# Patient Record
Sex: Male | Born: 1965
Health system: Southern US, Community
[De-identification: ages and names within clinical notes are randomized; demographics above are authoritative.]

## PROBLEM LIST (undated history)

## (undated) DIAGNOSIS — H539 Unspecified visual disturbance: Secondary | ICD-10-CM

## (undated) DIAGNOSIS — K529 Noninfective gastroenteritis and colitis, unspecified: Secondary | ICD-10-CM

## (undated) DIAGNOSIS — N529 Male erectile dysfunction, unspecified: Secondary | ICD-10-CM

## (undated) DIAGNOSIS — E0590 Thyrotoxicosis, unspecified without thyrotoxic crisis or storm: Secondary | ICD-10-CM

## (undated) HISTORY — DX: Noninfective gastroenteritis and colitis, unspecified: K52.9

## (undated) HISTORY — DX: Thyrotoxicosis, unspecified without thyrotoxic crisis or storm: E05.90

## (undated) HISTORY — PX: VASECTOMY: SHX75

## (undated) HISTORY — PX: APPENDECTOMY: SHX54

## (undated) HISTORY — DX: Unspecified visual disturbance: H53.9

## (undated) HISTORY — DX: Male erectile dysfunction, unspecified: N52.9

---

## 2003-07-10 ENCOUNTER — Encounter (INDEPENDENT_AMBULATORY_CARE_PROVIDER_SITE_OTHER): Payer: Self-pay

## 2003-07-10 ENCOUNTER — Ambulatory Visit (HOSPITAL_COMMUNITY): Admission: RE | Admit: 2003-07-10 | Discharge: 2003-07-10 | Payer: Self-pay | Admitting: Gastroenterology

## 2006-06-04 ENCOUNTER — Observation Stay (HOSPITAL_COMMUNITY): Admission: EM | Admit: 2006-06-04 | Discharge: 2006-06-05 | Payer: Self-pay | Admitting: Emergency Medicine

## 2006-06-04 ENCOUNTER — Encounter (INDEPENDENT_AMBULATORY_CARE_PROVIDER_SITE_OTHER): Payer: Self-pay | Admitting: *Deleted

## 2007-05-29 ENCOUNTER — Ambulatory Visit: Payer: Self-pay | Admitting: Family Medicine

## 2007-05-30 LAB — CONVERTED CEMR LAB
Cholesterol: 227 mg/dL (ref 0–200)
HDL: 47.9 mg/dL (ref >39.0)
PSA: 0.39 ng/mL (ref 0.10–4.00)
Total CHOL/HDL Ratio: 4.7

## 2007-06-01 ENCOUNTER — Encounter (INDEPENDENT_AMBULATORY_CARE_PROVIDER_SITE_OTHER): Payer: Self-pay | Admitting: *Deleted

## 2007-06-01 ENCOUNTER — Telehealth (INDEPENDENT_AMBULATORY_CARE_PROVIDER_SITE_OTHER): Payer: Self-pay | Admitting: *Deleted

## 2008-01-24 ENCOUNTER — Ambulatory Visit: Payer: Self-pay | Admitting: Internal Medicine

## 2008-01-27 LAB — CONVERTED CEMR LAB
ALT: 34 units/L (ref 0–53)
AST: 24 units/L (ref 0–37)
Albumin: 4 g/dL (ref 3.5–5.2)
CO2: 29 meq/L (ref 19–32)
Calcium: 9.2 mg/dL (ref 8.4–10.5)
Chloride: 104 meq/L (ref 96–112)
Eosinophils Absolute: 0.2 10*3/uL (ref 0.0–0.7)
GFR calc Af Amer: 104 mL/min
HCT: 44.3 % (ref 39.0–52.0)
Hemoglobin: 15.7 g/dL (ref 13.0–17.0)
Lymphocytes Relative: 29.4 % (ref 12.0–46.0)
MCV: 94.2 fL (ref 78.0–100.0)
Monocytes Absolute: 0.2 10*3/uL (ref 0.1–1.0)
Platelets: 251 10*3/uL (ref 150–400)
Potassium: 4.4 meq/L (ref 3.5–5.1)
Rhuematoid fact SerPl-aCnc: <20 intl units/mL — ABNORMAL LOW (ref 0.0–20.0)
Sodium: 139 meq/L (ref 135–145)
Total Bilirubin: 0.9 mg/dL (ref 0.3–1.2)
Total CK: 119 units/L (ref 7–195)

## 2008-01-29 ENCOUNTER — Encounter (INDEPENDENT_AMBULATORY_CARE_PROVIDER_SITE_OTHER): Payer: Self-pay | Admitting: *Deleted

## 2008-05-30 ENCOUNTER — Ambulatory Visit: Payer: Self-pay | Admitting: Internal Medicine

## 2008-06-02 DIAGNOSIS — N529 Male erectile dysfunction, unspecified: Secondary | ICD-10-CM

## 2008-06-02 HISTORY — DX: Male erectile dysfunction, unspecified: N52.9

## 2008-06-24 ENCOUNTER — Ambulatory Visit: Payer: Self-pay | Admitting: Internal Medicine

## 2008-06-24 DIAGNOSIS — F528 Other sexual dysfunction not due to a substance or known physiological condition: Secondary | ICD-10-CM | POA: Insufficient documentation

## 2008-06-24 DIAGNOSIS — F411 Generalized anxiety disorder: Secondary | ICD-10-CM | POA: Insufficient documentation

## 2008-06-24 DIAGNOSIS — N529 Male erectile dysfunction, unspecified: Secondary | ICD-10-CM | POA: Insufficient documentation

## 2008-06-25 ENCOUNTER — Encounter: Payer: Self-pay | Admitting: Internal Medicine

## 2008-08-16 ENCOUNTER — Telehealth (INDEPENDENT_AMBULATORY_CARE_PROVIDER_SITE_OTHER): Payer: Self-pay | Admitting: *Deleted

## 2008-09-04 ENCOUNTER — Encounter: Payer: Self-pay | Admitting: Internal Medicine

## 2008-09-24 ENCOUNTER — Ambulatory Visit: Payer: Self-pay | Admitting: Internal Medicine

## 2008-09-26 LAB — CONVERTED CEMR LAB
Cholesterol: 226 mg/dL
Direct LDL: 146.9 mg/dL
Free T4: 0.7 ng/dL
HDL: 48.8 mg/dL
T3, Free: 3.8 pg/mL
TSH: 0.41 u[IU]/mL
Total CHOL/HDL Ratio: 4.6
Triglycerides: 98 mg/dL
VLDL: 20 mg/dL

## 2009-03-18 ENCOUNTER — Ambulatory Visit: Payer: Self-pay | Admitting: Internal Medicine

## 2009-03-24 ENCOUNTER — Ambulatory Visit: Payer: Self-pay | Admitting: Family Medicine

## 2009-03-24 ENCOUNTER — Emergency Department (HOSPITAL_COMMUNITY): Admission: EM | Admit: 2009-03-24 | Discharge: 2009-03-24 | Payer: Self-pay | Admitting: Emergency Medicine

## 2009-03-25 ENCOUNTER — Encounter: Payer: Self-pay | Admitting: Internal Medicine

## 2009-03-26 ENCOUNTER — Telehealth: Payer: Self-pay | Admitting: Internal Medicine

## 2009-03-28 ENCOUNTER — Encounter: Payer: Self-pay | Admitting: Internal Medicine

## 2009-03-31 ENCOUNTER — Telehealth: Payer: Self-pay | Admitting: Internal Medicine

## 2009-06-06 ENCOUNTER — Encounter: Payer: Self-pay | Admitting: Internal Medicine

## 2009-06-06 LAB — CONVERTED CEMR LAB
AST: 29 units/L
Alkaline Phosphatase: 51 units/L
BUN: 13 mg/dL
CO2, serum: 26 mmol/L
Calcium: 8.6 mg/dL
Creatinine, Ser: 0.8 mg/dL
Glucose, Bld: 111 mg/dL
HCT: 39.9 %
Hemoglobin: 13.9 g/dL
RDW: 12.3 %
Sodium, serum: 133 mmol/L
WBC, blood: 7 10*3/uL

## 2009-07-01 ENCOUNTER — Encounter: Payer: Self-pay | Admitting: Internal Medicine

## 2009-10-03 ENCOUNTER — Ambulatory Visit: Payer: Self-pay | Admitting: Internal Medicine

## 2009-10-03 ENCOUNTER — Encounter (INDEPENDENT_AMBULATORY_CARE_PROVIDER_SITE_OTHER): Payer: Self-pay | Admitting: *Deleted

## 2009-12-19 ENCOUNTER — Ambulatory Visit: Payer: Self-pay | Admitting: Internal Medicine

## 2009-12-24 ENCOUNTER — Telehealth (INDEPENDENT_AMBULATORY_CARE_PROVIDER_SITE_OTHER): Payer: Self-pay | Admitting: *Deleted

## 2009-12-25 ENCOUNTER — Encounter (HOSPITAL_COMMUNITY): Admission: RE | Admit: 2009-12-25 | Discharge: 2010-03-04 | Payer: Self-pay | Admitting: Internal Medicine

## 2009-12-25 ENCOUNTER — Ambulatory Visit: Payer: Self-pay | Admitting: Internal Medicine

## 2009-12-25 ENCOUNTER — Ambulatory Visit: Payer: Self-pay

## 2009-12-26 ENCOUNTER — Telehealth: Payer: Self-pay | Admitting: Internal Medicine

## 2009-12-30 ENCOUNTER — Telehealth (INDEPENDENT_AMBULATORY_CARE_PROVIDER_SITE_OTHER): Payer: Self-pay | Admitting: *Deleted

## 2009-12-31 ENCOUNTER — Ambulatory Visit: Payer: Self-pay | Admitting: Internal Medicine

## 2009-12-31 LAB — CONVERTED CEMR LAB
Ferritin: 7.4 ng/mL — ABNORMAL LOW (ref 22.0–322.0)
Iron: 56 ug/dL (ref 42–165)
T3, Free: 2.8 pg/mL (ref 2.3–4.2)
Vitamin B-12: >1500 pg/mL — ABNORMAL HIGH (ref 211–911)

## 2010-01-02 ENCOUNTER — Telehealth: Payer: Self-pay | Admitting: Internal Medicine

## 2010-01-02 DIAGNOSIS — K5190 Ulcerative colitis, unspecified, without complications: Secondary | ICD-10-CM | POA: Insufficient documentation

## 2010-08-28 ENCOUNTER — Ambulatory Visit
Admission: RE | Admit: 2010-08-28 | Discharge: 2010-08-28 | Payer: Self-pay | Source: Home / Self Care | Attending: Internal Medicine | Admitting: Internal Medicine

## 2010-08-28 DIAGNOSIS — H60399 Other infective otitis externa, unspecified ear: Secondary | ICD-10-CM | POA: Insufficient documentation

## 2010-08-30 LAB — CONVERTED CEMR LAB
BUN: 18 mg/dL (ref 6–23)
Basophils Absolute: 0 10*3/uL (ref 0.0–0.1)
Basophils Relative: 0.3 % (ref 0.0–3.0)
CO2: 28 meq/L (ref 19–32)
Calcium: 8.7 mg/dL (ref 8.4–10.5)
Chloride: 109 meq/L (ref 96–112)
Cholesterol: 202 mg/dL — ABNORMAL HIGH (ref 0–200)
Creatinine, Ser: 1 mg/dL (ref 0.4–1.5)
Direct LDL: 139.9 mg/dL
GFR calc non Af Amer: 90.67 mL/min (ref >60)
Glucose, Bld: 83 mg/dL (ref 70–99)
HCT: 37.4 % — ABNORMAL LOW (ref 39.0–52.0)
Hemoglobin: 12.9 g/dL — ABNORMAL LOW (ref 13.0–17.0)
Lymphs Abs: 3.5 10*3/uL (ref 0.7–4.0)
MCV: 90.7 fL (ref 78.0–100.0)
Monocytes Absolute: 0.9 10*3/uL (ref 0.1–1.0)
Neutro Abs: 3.7 10*3/uL (ref 1.4–7.7)
Neutrophils Relative %: 45.7 % (ref 43.0–77.0)
RDW: 13.2 % (ref 11.5–14.6)
Triglycerides: 56 mg/dL (ref 0.0–149.0)

## 2010-09-01 NOTE — Consult Note (Signed)
Summary: back pain better, conservative treatment   [facility]   Imported By: Edmonia James 04/03/2009 10:01:20  _____________________________________________________________________  External Attachment:    Type:   Image     Comment:   External Document

## 2010-09-01 NOTE — Letter (Signed)
Summary: [place] No Show Letter  [facility]   [place], [place] [address]   Phone: [phone]  Fax: [phone]    10/03/2009 MRN: [id number]  [facility] [address] [place], [place]  [address]   Dear Mr. Chevez,   Our records indicate that you missed your scheduled appointment with Dr. Larose Kells on 10/03/09.  Please contact this office to reschedule your appointment as soon as possible.  It is important that you keep your scheduled appointments with your physician, so we can provide you the best care possible.  Please be advised that there may be a charge for "no show" appointments.    Sincerely,    at [employer]

## 2010-09-01 NOTE — Assessment & Plan Note (Signed)
Summary: Cardiology Nuclear Study  Nuclear Med Background Indications for Stress Test: Evaluation for Ischemia     Symptoms: Chest Pain, Nausea, SOB, Vomiting    Nuclear Pre-Procedure Cardiac Risk Factors: History of Smoking Caffeine/Decaff Intake: None NPO After: 8:00 PM Lungs: clear IV 0.9% NS with Angio Cath: 20g     IV Site: (R) AC IV Started by: Irven Baltimore RN Chest Size (in) 44     Height (in): 72 Weight (lb): 206 BMI: 28.04  Nuclear Med Study 1 or 2 day study:  1 day     Stress Test Type:  Stress Reading MD:  Dorris Carnes, MD     Referring MD:  J.Paz Resting Radionuclide:  Technetium 3mTetrofosmin     Resting Radionuclide Dose:  11.0 mCi  Stress Radionuclide:  Technetium 922metrofosmin     Stress Radionuclide Dose:  33.0 mCi   Stress Protocol Exercise Time (min):  9:45 min     Max HR:  155 bpm     Predicted Max HR:  17885pm  Max Systolic BP: 13027m Hg     Percent Max HR:  89.08 %     METS: 11.3 Rate Pressure Product:  20770    Stress Test Technologist:  SaPerrin MalteseMT-P     Nuclear Technologist:  StCharlton AmorNMT  Rest Procedure  Myocardial perfusion imaging was performed at rest 45 minutes following the intravenous administration of Myoview Technetium 9986mtrofosmin.  Stress Procedure  The patient exercised for 9:45. The patient stopped due to fatigue and denied any chest pain.  There were no significant ST-T wave changes and the patient became pale and very fatigued at the end of his exercise..  Myoview was injected at peak exercise and myocardial perfusion imaging was performed after a brief delay.  QPS Raw Data Images:  Images were motion corrected.  SOft tissue (diaphragm) underlies heart. Stress Images:  Normal perfusion with apical thinning. Rest Images:  Near normalization of the apical defect. Subtraction (SDS):  No evidence of ischemia. Transient Ischemic Dilatation:  .93  (Normal <1.22)  Lung/Heart Ratio:  .35  (Normal  <0.45)  Quantitative Gated Spect Images QGS EDV:  122 ml QGS ESV:  54 ml QGS EF:  56 %   Overall Impression  Exercise Capacity: Excellent exercise capacity. BP Response: Normal blood pressure response. Clinical Symptoms: No chest pain ECG Impression: No significant ST segment change suggestive of ischemia. Overall Impression Comments: Probable normal perfusion and mild apical thinning .  No evidence for significant ishemia.  Excellent exercise tolerance.   Appended Document: Cardiology Nuclear Study advise patient stress tes neg.   Appended Document: Cardiology Nuclear Study see phone note

## 2010-09-01 NOTE — Progress Notes (Signed)
Summary: add or redraw  additional labs  Phone Note Outgoing Call   Summary of Call: mild anemia, please add or redraw--------  iron, ferritin, B12   dx anemia TSH slightly suppressed--- ------ free T3, free T4    dx ? hyperthyroidism Belford Pascucci E. Damien Batty MD  Dec 26, 2009 10:09 AM  scheduled pt for additional labs next week......Marland KitchenDawson Bills  Dec 26, 2009 10:26 AM   New Problems: ? of HYPERTHYROIDISM (ICD-242.90) ANEMIA (ICD-285.9)   New Problems: ? of HYPERTHYROIDISM (ICD-242.90) ANEMIA (ICD-285.9)

## 2010-09-01 NOTE — Progress Notes (Signed)
Summary: results  Phone Note Outgoing Call Call back at Home Phone [phone] Call back at Work Phone [phone]   Reason for Call: Discuss lab or test results Details for Reason: advise patient stress tes neg.   Signed by Alda Berthold Paz MD on 12/28/2009 at 7:18 AM Summary of Call: left message on machine for pt to return call......Marland KitchenDawson Bills  Dec 30, 2009 1:26 PM discussed with pt............Marland KitchenDawson Bills  Dec 30, 2009 3:43 PM

## 2010-09-01 NOTE — Progress Notes (Signed)
Summary: lab results  Phone Note Outgoing Call   Summary of Call: --TSH slightly suppressed, TFTs  otherwise normal --hemoglobin slightly down,  iron normal. History of colitis, had a colonoscopy a few months ago called patient and LMOM: cholesterol okay hemoglobin is slightly down,   will fax labs  to GI plan this is the same , followup here in 3 months and needs a stress test Jose E. Paz MD  January 02, 2010 9:57 AM    New Problems: ULCERATIVE COLITIS (ICD-556.9)   New Problems: ULCERATIVE COLITIS (ICD-556.9)

## 2010-09-01 NOTE — Assessment & Plan Note (Signed)
Summary: cpx/kdc   Vital Signs:  Patient profile:   45 year old male Height:      72 inches Weight:      210 pounds BMI:     28.58 Pulse rate:   72 / minute BP sitting:   100 / 80  Vitals Entered By: Dawson Bills (Dec 19, 2009 8:41 AM) CC: cpx   History of Present Illness: CPX was under a lot of stress (finances) for the last few months, situation is better lately. during one of the most anxious nights a month ago, he had a one hour episode of chest pain, located in the whole chest and epigastrium, no radiation to the neck or arms, associated with nausea ,vomiting, shortness of breath but no diaphoresis. "I almost went to the ER" No further episodes   Boil at R inner leg, was seen a ta a UC yesterday, Rx meds, no I&D; saw some d/c  but overall is better today   Current Medications (verified): 1)  Viagra 100 Mg Tabs (Sildenafil Citrate) .Marland Kitchen.. 1 A Day As Needed  Allergies (verified): No Known Drug Allergies  Past History:  Past Medical History: Reviewed history from 06/24/2008 and no changes required. ED dx 11-09  Past Surgical History: Reviewed history from 05/29/2007 and no changes required. Appendectomy Vasectomy  Family History: Reviewed history from 06/24/2008 and no changes required. DM--M  HTN--M? 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 CV:  Denies palpitations and swelling of feet. Resp:  Denies cough and shortness of breath. GI:  occasionally nausea no heartburn . GU:  Denies dysuria, hematuria, and urinary hesitancy; lately, ED has not been a  issue. Psych:  ++ stress.  Physical Exam  General:  alert and well-developed.   Neck:   no masses and no thyromegaly.   Lungs:  normal respiratory effort, no intercostal retractions, no accessory muscle use, and normal breath sounds.   Heart:  normal rate, regular rhythm, no murmur, and no gallop.   Abdomen:  soft, non-tender, no distention, no masses, and no guarding.   Extremities:  no edema Skin:  has a 1.5 cm boil at the inner upper right leg. No fluctuancy    Impression & Recommendations:  Problem # 1:  PREVENTIVE HEALTH CARE (ICD-V70.0) Td 08 h/o Cscope per GI  he used to exercise more, encouraged to go back to his routine He is not smoking or drinking at all in the last few months,praised   Orders: Venipuncture (16967) TLB-BMP (Basic Metabolic Panel-BMET) (89381-OFBPZWC) TLB-Lipid Panel (80061-LIPID) TLB-TSH (Thyroid Stimulating Hormone) (84443-TSH) TLB-ALT (SGPT) (84460-ALT) TLB-AST (SGOT) (84450-SGOT) TLB-CBC Platelet - w/Differential (85025-CBCD)  Problem # 2:  CHEST PAIN (ICD-786.50) Assessment: New  one hour history of chest pain a month ago in the setting of severe anxiety He is a 45 year old gentleman, former smoker, no family history. last LDL 146. EKG today shows sinus bradycardia, no acute changes, no old EKGs Plan: Aspirin 81 Stress test  Orders: EKG w/ Interpretation (93000) Cardiology Referral (Cardiology)  Problem # 3:  ANXIETY (ICD-300.00) anxiety symptoms were severe but now better declined meds   Complete Medication List: 1)  Viagra 100 Mg Tabs (Sildenafil citrate) .Marland Kitchen.. 1 a  day as needed 2)  Aspirin 81 Mg Tbec (Aspirin) .... One daily  Patient Instructions: 1)  Please schedule a follow-up appointment in 3 months .    Preventive Care Screening  Prior Values:    PSA:  0.39 (05/29/2007)    Last Tetanus Booster:  Tdap (05/29/2007)

## 2010-09-01 NOTE — Progress Notes (Signed)
Summary: Nuclear Pre-Procedure  Phone Note Outgoing Call Call back at [facility] Phone [phone] Call back at Work Phone [phone]   Call placed by: Eliezer Lofts, EMT-P,  Dec 24, 2009 3:34 PM Call placed to: Patient Action Taken: Phone Call Completed Summary of Call: Reviewed information on Myoview Information Sheet (see scanned document for further details).  Spoke with Patient and his son, Aaron Edelman.    Nuclear Med Background Indications for Stress Test: Evaluation for Ischemia     Symptoms: Chest Pain, Nausea, SOB, Vomiting    Nuclear Pre-Procedure Cardiac Risk Factors: History of Smoking Height (in): 72

## 2010-09-09 NOTE — Assessment & Plan Note (Signed)
Summary: ears itching/kn   Vital Signs:  Patient profile:   45 year old male Weight:      227.13 pounds Pulse rate:   84 / minute Pulse rhythm:   regular BP sitting:   128 / 82  (left arm) Cuff size:   large  Vitals Entered By: Allyn Kenner CMA (August 28, 2010 10:54 AM) CC: Ears "itching"  Comments x 2 weeks some pain [facility] [address]    History of Present Illness: L>R ear itching and irritation for 3 weeks.  ROS No recent URIs No ear  discharge No decrease hearing He has a history of ulcerative colitis, denies nausea, vomiting, abdominal pain. No blood in the stools or diarrhea  Current Medications (verified): 1)  None  Allergies (verified): No Known Drug Allergies  Past History:  Past Medical History: ED dx 11-09 history of ulcerative colitis Chest pain, negative stress test 11/2009  Past Surgical History: Reviewed history from 05/29/2007 and no changes required. Appendectomy Vasectomy  Social History: Reviewed history from 12/19/2009 and no changes required. 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   Ears:  R ear normal and L ear ear canal is slightly red, no discharge. Both tympanic membranes are intact Psych:  not anxious appearing and not depressed appearing.     Impression & Recommendations:  Problem # 1:  EXTERNAL OTITIS (ICD-380.10) mild otitis externa, see prescription His updated medication list for this problem includes:    Vosol Hc 2-1 % Soln (Hydrocortisone-acetic acid) .Marland KitchenMarland KitchenMarland KitchenMarland Kitchen 5 drops in each ear 3 times a day for one week  Problem # 2:  ULCERATIVE COLITIS (ICD-556.9) currently asymptomatic, no taking  any meds    Complete Medication List: 1)  Vosol Hc 2-1 % Soln (Hydrocortisone-acetic acid)  .... 5 drops in each ear 3 times a day for one week  Patient Instructions: 1)  Please schedule a follow-up appointment in 4 months . fasting, Physical exam Prescriptions: VOSOL HC 2-1 % SOLN (HYDROCORTISONE-ACETIC ACID) 5 drops in each ear 3 times a day for one week  #1 x 0   Entered and Authorized by:   Alda Berthold. Presten Joost MD   Signed by:   Alda Berthold. Kaisey Huseby MD on 08/28/2010   Method used:   Print then Give to Patient   RxID:   [id number]    Orders Added: 1)  Est. Patient Level III [10315]

## 2010-12-18 NOTE — Op Note (Signed)
NAMEROHN, FRITSCH             ACCOUNT NO.:  [id number]   MEDICAL RECORD NO.:  [id number]          PATIENT TYPE:  INP   LOCATION:  [place]                         FACILITY:  [facility]   PHYSICIAN:  Georgina Quint, M.D.   DATE OF BIRTH:  [birth year]   DATE OF PROCEDURE:  06/05/2006  DATE OF DISCHARGE:  06/05/2006                                 OPERATIVE REPORT   PRE AND POSTOPERATIVE DIAGNOSIS:  Acute appendicitis.   OPERATION:  Laparoscopic appendectomy.   SURGEON:  Georgina Quint, M.D.   ANESTHESIA:  Local and general.   BLOOD LOSS:  Was minimal.   COMPLICATIONS:  None.   SPECIMEN:  Appendix.   PROCEDURE:  After the patient was monitored and asleep and had a Foley  catheter and routine preparation and draping the abdomen, I anesthetized  spot just below the umbilicus and made a short vertical incision, then about  a 2 cm incision in the midline fascia and bluntly entered the peritoneum.  I  put a 0 Vicryl for pursestring suture in the fascia and secured a Hassan  cannula and inflated the abdomen with carbon dioxide.  After adequate  pneumoperitoneum, I viewed the right lower quadrant and saw inflammation.  I  saw no other abnormalities of the viscera, after placing two additional  ports through anesthetized sites, I retracted the cecum upward and saw an  inflamed appendix.  It was somewhat difficult to mobilize but with  persistence, I was able to mobilize it and thin down the mesentery enough  that I could staple across the appendix and mesentery with cutting  endoscopic stapler.  I placed the appendix in a plastic pouch.  I got  hemostasis with cautery.  I irrigated the area and removed the irrigant.  There was a small amount of fluid in the pelvis which I removed also.  The  sponge, needle and instrument counts were correct.  I withdrew the appendix  from the abdomen through the umbilical incision and tied the pursestring  suture.  I removed the right upper quadrant port  under direct view and saw  no bleeding from the belly wall.  After allowing carbon dioxide to escape, I  removed the left lower quadrant port.  I closed the skin incisions with  intracuticular 4-0 Vicryl and Steri-Strips.  The patient tolerated the  operation well.      Georgina Quint, M.D.  Electronically Signed     WB/MEDQ  D:  06/05/2006  T:  06/06/2006  Job:  [id number]

## 2010-12-18 NOTE — Op Note (Signed)
Travis Greene, Travis Greene                         ACCOUNT NO.:  [id number]   MEDICAL RECORD NO.:  [id number]                   PATIENT TYPE:  AMB   LOCATION:  ENDO                                 FACILITY:  [facility]   PHYSICIAN:  Wonda Horner, M.D.                DATE OF BIRTH:  [birth year]   DATE OF PROCEDURE:  07/10/2003  DATE OF DISCHARGE:                                 OPERATIVE REPORT   PROCEDURE:  Colonoscopy with biopsy.   INDICATIONS FOR PROCEDURE:  Heme positive stool, rectal bleeding.   Informed consent was obtained after explanation of the risks of bleeding,  infection and perforation.   PREMEDICATION:  Fentanyl 75 mcg IV, Versed 8 mg IV.   DESCRIPTION OF PROCEDURE:  With the patient in the left lateral decubitus  position, a rectal exam was performed and no masses were felt. The Olympus  colonoscope was then inserted into the rectum and advanced around the colon  to the cecum. Cecal landmarks were identified. The cecum and ascending colon  were normal. The transverse colon was normal.  The descending colon was  normal.  The sigmoid and rectum revealed erythematous mucosa with loss of  the appearance of the normal underlying submucosal vasculature with findings  that seemed consistent with a proctosigmoiditis.  Biopsies were obtained  from the sigmoid and rectum for histology.  He tolerated the procedure well  without complications.   IMPRESSION:  Proctosigmoiditis, biopsies pending.   PLAN:  The biopsies will be checked, further decisions will be made after  review of the biopsies.                                               Wonda Horner, M.D.    SFG/MEDQ  D:  07/10/2003  T:  07/10/2003  Job:  [id number]   cc:   Leone Haven, M.D.  [address]  [place]  [place] [address]  Fax: [phone]

## 2010-12-29 ENCOUNTER — Ambulatory Visit (INDEPENDENT_AMBULATORY_CARE_PROVIDER_SITE_OTHER): Payer: BC Managed Care – PPO | Admitting: Internal Medicine

## 2010-12-29 ENCOUNTER — Encounter: Payer: Self-pay | Admitting: Internal Medicine

## 2010-12-29 DIAGNOSIS — L989 Disorder of the skin and subcutaneous tissue, unspecified: Secondary | ICD-10-CM | POA: Insufficient documentation

## 2010-12-29 DIAGNOSIS — Z0000 Encounter for general adult medical examination without abnormal findings: Secondary | ICD-10-CM | POA: Insufficient documentation

## 2010-12-29 DIAGNOSIS — K5190 Ulcerative colitis, unspecified, without complications: Secondary | ICD-10-CM

## 2010-12-29 LAB — LIPID PANEL
Cholesterol: 195 mg/dL (ref 0–200)
HDL: 53.2 mg/dL (ref >39.00)
Triglycerides: 121 mg/dL (ref 0.0–149.0)

## 2010-12-29 LAB — CBC WITH DIFFERENTIAL/PLATELET
Basophils Absolute: 0 10*3/uL (ref 0.0–0.1)
Basophils Relative: 0.4 % (ref 0.0–3.0)
Eosinophils Absolute: 0.1 10*3/uL (ref 0.0–0.7)
Eosinophils Relative: 1.5 % (ref 0.0–5.0)
HCT: 45.5 % (ref 39.0–52.0)
Lymphocytes Relative: 36.4 % (ref 12.0–46.0)
MCHC: 34.9 g/dL (ref 30.0–36.0)
Neutro Abs: 3.4 10*3/uL (ref 1.4–7.7)
Neutrophils Relative %: 52 % (ref 43.0–77.0)
WBC: 6.5 10*3/uL (ref 4.5–10.5)

## 2010-12-29 LAB — AST: AST: 20 U/L (ref 0–37)

## 2010-12-29 LAB — BASIC METABOLIC PANEL
Calcium: 9.1 mg/dL (ref 8.4–10.5)
Creatinine, Ser: 1 mg/dL (ref 0.4–1.5)
Glucose, Bld: 100 mg/dL — ABNORMAL HIGH (ref 70–99)
Sodium: 140 mEq/L (ref 135–145)

## 2010-12-29 MED ORDER — DOXYCYCLINE HYCLATE 100 MG PO TABS: 100.0000 mg | ORAL_TABLET | Freq: Two times a day (BID) | ORAL | Status: AC

## 2010-12-29 NOTE — Assessment & Plan Note (Signed)
Recommend to see GI at least yearly. Definitely call them if he continue with blood per rectum. Labs

## 2010-12-29 NOTE — Assessment & Plan Note (Addendum)
The patient has skin tags in the axillary area, recommend observation, will call if any skin lesion change color,  gets bigger. He has folliculitis and in her leg. Doxycycline for a week, will call if things get worse.

## 2010-12-29 NOTE — Assessment & Plan Note (Addendum)
Td 08 Counseling about diet, exercise. Labs.

## 2010-12-29 NOTE — Progress Notes (Signed)
  Subjective:    Patient ID: Travis Greene, male    DOB: 09-23-1965, 45 y.o.   MRN: [id number]  HPI CPX  Past Medical History  Diagnosis Date  . ED (erectile dysfunction) 11/09  . Ulcerative colitis Dx 2005    [facility], last Cscope 2010 Dr Oletta Lamas, was Rx lialda  . Chest pain     negative stress test 11/2009   Past Surgical History  Procedure Date  . Appendectomy   . Vasectomy    Family History: Reviewed history from 06/24/2008 and no changes required. DM--M  HTN--M? 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 2 days ago developed swelling and some pain at the day inner R leg close to the groin. No fever or discharge. Denies any chest pain or shortness of breath No lower extremity edema No dysuria or gross hematuria. He has a history of ulcerative colitis, he self discontinued lialda ~ 6 months ago, he is nearly asymptomatic. Denies any nausea, vomiting, diarrhea. Rarely, see fresh red blood per rectum occasionally dark blood as well. No weight loss, no fever.    Objective:   Physical Exam  Constitutional: He is oriented to person, place, and time. He appears well-developed. No distress.  HENT:  Head: Normocephalic and atraumatic.  Neck: No thyromegaly present.       Normal carotid pulses bilateral  Cardiovascular: Normal rate, regular rhythm and normal heart sounds.   No murmur heard. Pulmonary/Chest: Effort normal and breath sounds normal. No respiratory distress. He has no wheezes. He has no rales.  Abdominal: Soft. He exhibits no distension. There is no tenderness. There is no rebound and no guarding.  Musculoskeletal: He exhibits no edema.  Neurological: He is alert and oriented to person,  place, and time.  Skin: Skin is warm and dry. He is not diaphoretic.       About 3 skin tags on each axillary area, they don't look  suspicious. On the inner right leg close to the groin he has 3, less than 1 cm soft masses, slightly tender, no red, slightly warm.  Psychiatric: He has a normal mood and affect. His behavior is normal. Judgment and thought content normal.          Assessment & Plan:

## 2010-12-29 NOTE — Patient Instructions (Signed)
Antibiotics for one week, call if you get worse. Also I recommend you to see your stomach doctor at least once a year, definitely call him if you still see blood in the stools.

## 2011-07-28 ENCOUNTER — Ambulatory Visit (INDEPENDENT_AMBULATORY_CARE_PROVIDER_SITE_OTHER): Payer: BC Managed Care – PPO

## 2011-07-28 DIAGNOSIS — IMO0001 Reserved for inherently not codable concepts without codable children: Secondary | ICD-10-CM

## 2011-07-28 DIAGNOSIS — H9209 Otalgia, unspecified ear: Secondary | ICD-10-CM

## 2011-11-07 ENCOUNTER — Ambulatory Visit (INDEPENDENT_AMBULATORY_CARE_PROVIDER_SITE_OTHER): Payer: BC Managed Care – PPO | Admitting: Internal Medicine

## 2011-11-07 VITALS — BP 128/84 | HR 73 | Temp 98.9°F | Resp 18 | Ht 70.0 in | Wt 230.0 lb

## 2011-11-07 DIAGNOSIS — J0100 Acute maxillary sinusitis, unspecified: Secondary | ICD-10-CM

## 2011-11-07 DIAGNOSIS — H6590 Unspecified nonsuppurative otitis media, unspecified ear: Secondary | ICD-10-CM

## 2011-11-07 DIAGNOSIS — H6592 Unspecified nonsuppurative otitis media, left ear: Secondary | ICD-10-CM

## 2011-11-07 MED ORDER — FLUTICASONE PROPIONATE 50 MCG/ACT NA SUSP: 2.0000 | Freq: Every day | NASAL | Status: DC

## 2011-11-07 MED ORDER — AMOXICILLIN 875 MG PO TABS: 875.0000 mg | ORAL_TABLET | Freq: Two times a day (BID) | ORAL | Status: DC

## 2011-11-07 NOTE — Progress Notes (Signed)
  Subjective:    Patient ID: Travis Greene, male    DOB: May 26, 1966, 46 y.o.   MRN: [id number]  HPI Travis Greene is here with complaints of left ear pain.  The pain started "a few weeks" ago and radiates into the left side of his face. He is here with his daughter, who helps translates, though he does speak and understand some [place]. 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  He tells me he had the same problem 4 months ago, the antibiotics helped and then the symptoms returned.  He denies any sinus problems, allergies, or other respiratory illnesses.    Review of Systems  Constitutional: Negative.   HENT: Positive for ear pain. Negative for congestion, facial swelling, rhinorrhea, sneezing, neck stiffness, postnasal drip, tinnitus and ear discharge.   Eyes: Negative.   Respiratory: Negative.   Cardiovascular: Negative.   Genitourinary: Negative.   Musculoskeletal: Negative.   Neurological: Negative.   Hematological: Negative.   Psychiatric/Behavioral: Negative.   All other systems reviewed and are negative.       Objective:   Physical Exam  Vitals reviewed. Constitutional: He is oriented to person, place, and time. He appears well-developed and well-nourished.  HENT:  Head: Normocephalic and atraumatic.  Right Ear: External ear normal.  Left Ear: External ear normal.  Mouth/Throat: Oropharynx is clear and moist. No oropharyngeal exudate.       Right TM difficult to visualize secondary to hair and pt reluctance, drum white with some central redness over malleolus, normal IAC  Pulmonary/Chest: Effort normal and breath sounds normal.  Neurological: He is alert and oriented to person, place, and time.  Skin: Skin is warm and dry.  Psychiatric: He has a normal mood and affect. His behavior is normal.          Assessment & Plan:  Abnormal left TM, suspect serous otitis, possible cholesteotoma.  Amoxicillin 875 bid for 10 days and Flonase QD.  RTC if not improved, I feel he may need an  ENT appt if his symptoms do not resolve to get a better look at his middle ear.

## 2011-11-07 NOTE — Patient Instructions (Signed)
Take all your amoxicillin and use the flonase everyday!  If your ear pain persists, return to our clinic. Sinusitis (Sinusitis) Los senos paranasales son bolsas de aire que se encuentran dentro de los huesos de la cara. La aparicin de bacterias en los senos paranasales lleva a una infeccin. Esta se denomina sinusitis.Estas infecciones generalmente son el resultado de una obstruccin en los orificios que [employer] senos.  SNTOMAS Generalmente, segn que seno se infecte, habr diferentes reas en las que Horticulturist, commercial.   Los senos maxilares generalmente producen dolor detrs de los ojos.   La sinusitis frontal ocasiona dolor en el medio de la frente y [place] ojos.  Entre otros problemas (sntomas) se incluyen:  Dolor en la zona posterior de los dientes superiores.   Una secrecin similar al pus (purulenta) proveniente de la nariz.   Toda inflamacin, calor o sensibilidad en estas mismas reas son indicios de infeccin.  TRATAMIENTO La sinusitis se diagnostica a travs del examen fsico y radiografas. Si le han tomado radiografas, asegrese de [employer] . O consulte el modo en que podr obtenerlos. Su mdico le prescribir medicamentos (antibiticos). Estos medicamentos se indican para combatir la infeccin. Tambin le prescribir un descongestivo para reducir la inflamacin del seno paranasal.  INSTRUCCIONES PARA EL CUIDADO DOMICILIARIO  Utilice los medicamentos de venta libre o de prescripcin para Conservation officer, historic buildings, el malestar o la [place], segn se lo indique el profesional que lo asiste.   Beba gran cantidad de lquidos. Los lquidos ayudan a que las mucosas de los senos nasales drenen ms fcilmente.   Aplique bolsas de calor hmedo o hielo en las zonas ms doloridas para [employer].   Utilice Sprays nasales salinos para ayudar a humedecer los senos nasales. Estos pueden encontrarse en la farmacia local.  SOLICITE ATENCIN MDICA DE INMEDIATO SI:  Jaclynn Guarneri.   Dolor en aumento, dolor de cabeza intenso o dolor de dientes.   Nuseas, vmitos o sudoracin.   Hinchazn infrecuente alrededor del rostro o problemas en la visin.  EST SEGURO QUE:   Comprende las instrucciones para el alta mdica.   Controlar su enfermedad.   Solicitar atencin mdica de inmediato segn las indicaciones.  Document Released: 04/28/2005 Document Revised: 07/08/2011 [facility] Patient Information 2012 [place].

## 2011-12-30 ENCOUNTER — Ambulatory Visit (INDEPENDENT_AMBULATORY_CARE_PROVIDER_SITE_OTHER): Payer: BC Managed Care – PPO | Admitting: Internal Medicine

## 2011-12-30 ENCOUNTER — Encounter: Payer: Self-pay | Admitting: Internal Medicine

## 2011-12-30 VITALS — BP 132/84 | HR 75 | Temp 98.3°F | Wt 225.0 lb

## 2011-12-30 DIAGNOSIS — E0590 Thyrotoxicosis, unspecified without thyrotoxic crisis or storm: Secondary | ICD-10-CM

## 2011-12-30 DIAGNOSIS — Z0000 Encounter for general adult medical examination without abnormal findings: Secondary | ICD-10-CM

## 2011-12-30 DIAGNOSIS — G834 Cauda equina syndrome: Secondary | ICD-10-CM | POA: Insufficient documentation

## 2011-12-30 DIAGNOSIS — K5190 Ulcerative colitis, unspecified, without complications: Secondary | ICD-10-CM

## 2011-12-30 HISTORY — DX: Thyrotoxicosis, unspecified without thyrotoxic crisis or storm: E05.90

## 2011-12-30 LAB — CBC WITH DIFFERENTIAL/PLATELET
Basophils Relative: 0.7 % (ref 0.0–3.0)
Eosinophils Relative: 2 % (ref 0.0–5.0)
HCT: 40.7 % (ref 39.0–52.0)
Hemoglobin: 13.8 g/dL (ref 13.0–17.0)
Lymphs Abs: 2.1 10*3/uL (ref 0.7–4.0)
Monocytes Absolute: 0.8 10*3/uL (ref 0.1–1.0)
Monocytes Relative: 11.7 % (ref 3.0–12.0)
Neutro Abs: 4 10*3/uL (ref 1.4–7.7)
Platelets: 279 10*3/uL (ref 150.0–400.0)
RBC: 4.4 Mil/uL (ref 4.22–5.81)
WBC: 7.1 10*3/uL (ref 4.5–10.5)

## 2011-12-30 LAB — LIPID PANEL
LDL Cholesterol: 110 mg/dL — ABNORMAL HIGH (ref 0–99)
Total CHOL/HDL Ratio: 4
Triglycerides: 99 mg/dL (ref 0.0–149.0)

## 2011-12-30 LAB — COMPREHENSIVE METABOLIC PANEL
ALT: 23 U/L (ref 0–53)
Albumin: 3.6 g/dL (ref 3.5–5.2)
CO2: 27 mEq/L (ref 19–32)
Calcium: 8.4 mg/dL (ref 8.4–10.5)
Chloride: 103 mEq/L (ref 96–112)
GFR: 102.2 mL/min (ref >60.00)
Glucose, Bld: 94 mg/dL (ref 70–99)
Sodium: 138 mEq/L (ref 135–145)
Total Protein: 6.9 g/dL (ref 6.0–8.3)

## 2011-12-30 LAB — T4, FREE: Free T4: 0.78 ng/dL (ref 0.60–1.60)

## 2011-12-30 MED ORDER — FLUTICASONE PROPIONATE 50 MCG/ACT NA SUSP: 2.0000 | Freq: Every day | NASAL | Status: DC

## 2011-12-30 NOTE — Patient Instructions (Addendum)
For cough, take Mucinex DM twice a day as needed  Flonase 2 sprays in each side of the nose every day until the cough is gone Call if no better in few days Call anytime if the symptoms are severe  We are sending you back to see the stomach doctor

## 2011-12-30 NOTE — Progress Notes (Signed)
  Subjective:    Patient ID: Travis Greene, male    DOB: 04/09/1966, 46 y.o.   MRN: [id number]  HPI Complete physical exam In general doing well but has to issues: 2 days history of cough, moderate headache which is worse with cough. Denies fevers or chills. Has not taken any medication to treat symptoms. Also he has a history of ulcerative colitis, for the last 2 weeks he has noted some watery discharge (rectal)  along with blood mixed with otherwise normal stools  Past Medical History  Diagnosis Date  . ED (erectile dysfunction) 11/09  . Ulcerative colitis Dx 2005    [facility], last Cscope 2010 Dr Oletta Lamas, was Rx lialda  . Chest pain     negative stress test 11/2009   Past Surgical History  Procedure Date  . Appendectomy   . Vasectomy    History   Social History  . Marital Status: Married    Spouse Name: N/A    Number of Children: 19  . Years of Education: N/A   Occupational History  .  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 Smoking status: Former Smoker -- 1.0 packs/day for 2 years    Types: Cigarettes    Quit date: 11/06/1997  . Smokeless tobacco: Never Used  . Alcohol Use: No  . Drug Use: No     in the past   . Sexually Active: Not on file   Other Topics Concern  . Not on file   Social History Narrative   [name], from [place]----no regular exercise but active--- diet regular, improving some    Family History  Problem Relation Age of Onset  . Diabetes Mother   . Hypertension Mother     ?  . Coronary artery disease Neg Hx   . Prostate cancer Neg Hx   . Colon cancer Neg Hx      Review of Systems Denies abdominal pain, fevers, nausea or vomiting. Denies chest pain or shortness of breath No dysuria or gross hematuria. Not recent use of antibiotics.     Objective:   Physical Exam  General -- alert, well-developed, and central obesity noted. No apparent distress.  Neck --no thyromegaly HEENT -- TMs normal, throat w/o redness, face  symmetric and not tender to palpation, nose slt congested;  EOMI  Lungs -- normal respiratory effort, no intercostal retractions, no accessory muscle use, and normal breath sounds.   Heart-- normal rate, regular rhythm, no murmur, and no gallop.   Abdomen--soft, non-tender, no distention, no masses, no HSM, no guarding, and no rigidity.  DRE-- external inspection normal, exam limited by patient discomfort, I did not found any stools, mucus or blood.  Extremities-- no pretibial edema bilaterally  Neurologic-- alert & oriented X3 and strength normal in all extremities. Psych-- Cognition and judgment appear intact. Alert and cooperative with normal attention span and concentration.  not anxious appearing and not depressed appearing.         Assessment & Plan:

## 2011-12-30 NOTE — Assessment & Plan Note (Signed)
Mildly suppressed TSH over the years. Recheck TFTs

## 2011-12-30 NOTE — Assessment & Plan Note (Addendum)
Td 08 Counseling about diet, exercise. Labs.  Also he presents with cough, see instructions.

## 2011-12-30 NOTE — Assessment & Plan Note (Addendum)
Seems to be having a mild exacerbation based on history, will refer to GI. Labs will be drawn today. Also recommend  ER if he has severe symptoms

## 2012-01-03 ENCOUNTER — Encounter: Payer: Self-pay | Admitting: *Deleted

## 2012-02-13 ENCOUNTER — Ambulatory Visit (INDEPENDENT_AMBULATORY_CARE_PROVIDER_SITE_OTHER): Payer: BC Managed Care – PPO | Admitting: Family Medicine

## 2012-02-13 VITALS — BP 133/87 | HR 66 | Temp 98.0°F | Resp 18 | Ht 70.0 in | Wt 221.0 lb

## 2012-02-13 DIAGNOSIS — B86 Scabies: Secondary | ICD-10-CM

## 2012-02-13 MED ORDER — PERMETHRIN 5 % EX CREA: TOPICAL_CREAM | Freq: Once | CUTANEOUS | Status: AC

## 2012-02-13 NOTE — Progress Notes (Signed)
  Subjective:    Patient ID: Morgon Pamer, male    DOB: 11/06/1965, 46 y.o.   MRN: [id number]  HPI Clif Serio is a 46 y.o. male Hx of ulcerative colitis. Itchy rash - both forearms past 4 days. And inbetween fingers. No known sick contacts.  Tx: hydroxyzine 3 times per day, and topical steroid few times per day.    [employer] account executive.  Review of Systems As above. No fever, no other rash.  No hx of rash with ulcerative colitis.  No new meds.     Objective:   Physical Exam  Constitutional: He is oriented to person, place, and time. He appears well-developed and well-nourished.  HENT:  Head: Normocephalic and atraumatic.  Pulmonary/Chest: Effort normal. No respiratory distress. He has no wheezes.  Neurological: He is alert and oriented to person, place, and time.  Skin: Skin is warm and dry. Rash noted. No petechiae and no purpura noted. Rash is papular.          Erythematous paules and linear ares R and L forearms to hands and wrists, with multiple interdigital lesions. Secondary excoriation.  Psychiatric: He has a normal mood and affect. His behavior is normal.       Assessment & Plan:  Joram Venson is a 46 y.o. male Suspected scabies, with interdigital lesions and pruritus. elimite cream overnight, repeat in 1 week if needed.  Discussed washing bedclothes in hot water tomorrow am. Rtc precautions given. Understanding expressed.

## 2012-02-13 NOTE — Patient Instructions (Signed)
Return to the clinic or go to the nearest emergency room if any of your symptoms worsen or new symptoms occur.   Escabiosis (Scabies) La escabiosis son pequeos parsitos (caros) que horadan la piel y causan protuberancias rojas y Lexicographer. Estos parsitos slo pueden verse en el microscopio. Son Orlene Erm contagiosos. Se diseminan fcilmente de [place] persona a otra por contacto directo. Tambin el contagio se produce al compartir prendas de vestir o ropa de cama. No es infrecuente que una familia entera se infecte al compartir toallas, prendas de vestir o ropa de cama.  [place]  El profesional que lo asiste podr prescribirle alguna crema o locin para eliminar los caros. Si se le prescribe, masajee la crema en cada centmetro cuadrado de piel, desde el cuello hasta las plantas de los pies. Tambin aplique la crema en el cuero cabelludo y rostro si se trata de un nio de menos de 1 ao. Evite aplicarla en los ojos y en la boca.   Djela durante 8 a 12 horas. No se lave las manos despus de la aplicacin. El nio podr baarse o darse una ducha despus de 8 a 12 horas de la aplicacin. A veces es til [employer] crema justo antes de la hora de dormir.   Generalmente un tratamiento es suficiente y eliminar aproximadamente el 95% de las infecciones. El los casos graves se indicar repetir el tratamiento luego de 1 semana. Todas las personas que habitan en la misma casa deben tratarse con una aplicacin de la crema.   No debern aparecer nuevas erupciones ni galeras luego de las 24 a 48 horas del tratamiento; sin embargo la picazn podra durar de 2 a 4 semanas despus del tratamiento. Si los sntomas persisten por ms tiempo, concurra al mdico nuevamente.   ste podr tambin prescribirle un medicamento para ayudarle con la picazn o hacer que desaparezca ms rpidamente.   Estos parsitos pueden vivir en la ropa hasta 3 das. Lave con agua caliente y seque a  temperatura elevada durante 20 minutos todas las prendas, toallas, peluches y ropa de cama que el nio haya usado recientemente. Las prendas que no pueden lavarse, debern ser colocadas en una bolsa plstica durante al menos 3 das.   Para ayudar a Barrister's clerk, bae al nio con agua FRESCA o coloque paos fros en las zonas afectadas.   El nio podr regresar a la escuela despus del tratamiento con la crema prescripta.  SOLICITE ANTENCIN MDICA SI:  La picazn persiste durante ms de 4 semanas despus del tratamiento.   La picazn se expande o se infecta (la zona tiene ampollas rojas o costras amarillentas).  Document Released: 04/28/2005 Document Revised: 07/08/2011 [facility] Patient Information 2012 [employer].

## 2012-04-10 ENCOUNTER — Ambulatory Visit (INDEPENDENT_AMBULATORY_CARE_PROVIDER_SITE_OTHER): Payer: BC Managed Care – PPO | Admitting: Family Medicine

## 2012-04-10 VITALS — BP 127/74 | HR 75 | Temp 98.2°F | Ht 70.0 in | Wt 225.0 lb

## 2012-04-10 DIAGNOSIS — H6690 Otitis media, unspecified, unspecified ear: Secondary | ICD-10-CM

## 2012-04-10 MED ORDER — AMOXICILLIN 875 MG PO TABS: 875.0000 mg | ORAL_TABLET | Freq: Two times a day (BID) | ORAL | Status: AC

## 2012-04-10 NOTE — Assessment & Plan Note (Signed)
New.  Pt w/ L OM.  Start abx.  Reviewed supportive care and red flags that should prompt return.  Pt expressed understanding and is in agreement w/ plan.

## 2012-04-10 NOTE — Patient Instructions (Addendum)
This appears to be an ear infection Start the Amoxicillin twice daily w/ food Tylenol or ibuprofen as needed for pain Drink plenty of fluids REST! Hang in there!!!

## 2012-04-10 NOTE — Progress Notes (Signed)
  Subjective:    Patient ID: Travis Greene, male    DOB: [birth year], 46 y.o.   MRN: [id number]  HPI Ear pain- L ear pain, sxs started 2 days ago.  Similar sxs 1 yr ago.  No fevers.  Reports sometimes pain will encompass the L side of face/head.  No known sick contacts.  + 'whooshing' noise.  No drainage from ear.  Mild R ear pain/itch.   Review of Systems For ROS see HPI     Objective:   Physical Exam  Vitals reviewed. Constitutional: He appears well-developed and well-nourished. No distress.  HENT:  Head: Normocephalic and atraumatic.  Right Ear: Tympanic membrane, external ear and ear canal normal. Tympanic membrane is not injected and not erythematous. No middle ear effusion.  Left Ear: External ear and ear canal normal. Tympanic membrane is scarred and erythematous. A middle ear effusion is present.  Nose: Mucosal edema (L>R) present. No rhinorrhea. Right sinus exhibits no maxillary sinus tenderness and no frontal sinus tenderness. Left sinus exhibits no maxillary sinus tenderness and no frontal sinus tenderness.  Mouth/Throat: Oropharynx is clear and moist. No oropharyngeal exudate.       No TTP over mastoids  Neck: Normal range of motion. Neck supple.  Lymphadenopathy:    He has no cervical adenopathy.          Assessment & Plan:

## 2013-01-12 ENCOUNTER — Encounter: Payer: Self-pay | Admitting: Internal Medicine

## 2013-01-12 ENCOUNTER — Ambulatory Visit (INDEPENDENT_AMBULATORY_CARE_PROVIDER_SITE_OTHER): Payer: BC Managed Care – PPO | Admitting: Internal Medicine

## 2013-01-12 VITALS — BP 122/84 | HR 60 | Temp 97.8°F | Ht 71.0 in | Wt 230.0 lb

## 2013-01-12 DIAGNOSIS — Z0000 Encounter for general adult medical examination without abnormal findings: Secondary | ICD-10-CM

## 2013-01-12 LAB — COMPREHENSIVE METABOLIC PANEL
Albumin: 3.7 g/dL (ref 3.5–5.2)
Alkaline Phosphatase: 57 U/L (ref 39–117)
BUN: 15 mg/dL (ref 6–23)
Creatinine, Ser: 0.8 mg/dL (ref 0.4–1.5)
Glucose, Bld: 93 mg/dL (ref 70–99)

## 2013-01-12 LAB — CBC WITH DIFFERENTIAL/PLATELET
Eosinophils Absolute: 0.1 10*3/uL (ref 0.0–0.7)
Hemoglobin: 15.4 g/dL (ref 13.0–17.0)
Lymphocytes Relative: 36 % (ref 12.0–46.0)
Lymphs Abs: 1.8 10*3/uL (ref 0.7–4.0)
MCV: 92.9 fl (ref 78.0–100.0)
Monocytes Absolute: 0.5 10*3/uL (ref 0.1–1.0)
Monocytes Relative: 10.4 % (ref 3.0–12.0)
Neutrophils Relative %: 50.7 % (ref 43.0–77.0)
RBC: 4.75 Mil/uL (ref 4.22–5.81)
WBC: 5 10*3/uL (ref 4.5–10.5)

## 2013-01-12 LAB — LIPID PANEL: HDL: 51 mg/dL (ref >39.00)

## 2013-01-12 LAB — LDL CHOLESTEROL, DIRECT: Direct LDL: 145.6 mg/dL

## 2013-01-12 MED ORDER — SILDENAFIL CITRATE 100 MG PO TABS: 50.0000 mg | ORAL_TABLET | Freq: Every day | ORAL | Status: DC | PRN

## 2013-01-12 NOTE — Assessment & Plan Note (Addendum)
Td 08 Doing well w/ diet, exercise. Labs.  Mild ED, vascular exam (-), used Viagra before---> prescription provided Ulcerative colitis, will see GI soon, we'll forward them to results. Symptoms seem well controlled.

## 2013-01-12 NOTE — Patient Instructions (Addendum)
Stay active and continue eating healthy Next visit in one year

## 2013-01-12 NOTE — Progress Notes (Signed)
  Subjective:    Patient ID: Travis Greene, male    DOB: [birth year], 47 y.o.   MRN: [id number]  HPI CPX  Past Medical History  Diagnosis Date  . ED (erectile dysfunction) 11/09  . Ulcerative colitis Dx 2005    [facility], last Cscope 2010 Dr Oletta Lamas, was Rx lialda  . Chest pain     negative stress test 11/2009   Past Surgical History  Procedure Laterality Date  . Appendectomy    . Vasectomy     History   Social History  . Marital Status: Married    Spouse Name: N/A    Number of Children: 42  . Years of Education: N/A   Occupational History  .  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 Smoking status: Former Smoker -- 1.00 packs/day for 2 years    Types: Cigarettes    Quit date: 11/06/1997  . Smokeless tobacco: Never Used  . Alcohol Use: No  . Drug Use: No     Comment: in the past   . Sexually Active: Not on file   Other Topics Concern  . Not on file   Social History Narrative   [name], from [place] w/ wife and 3 of his 5 children   Family History  Problem Relation Age of Onset  . Diabetes Mother   . Hypertension Mother     ?  . Coronary artery disease Neg Hx   . Prostate cancer Neg Hx   . Colon cancer Neg Hx      Review of Systems Reports a healthy diet, walks one hour daily at a medium pace. Occasional erectile dysfunction, use to take Viagra with some help and no side effects. No chest pain or shortness or breath No nausea, vomiting, diarrhea blood in the stools. No dysuria gross hematuria. Some stress, not severe, no anxiety or depression.     Objective:   Physical Exam  General -- alert, well-developed, nad .   Neck --no thyromegaly , normal carotid pulse Lungs -- normal respiratory effort, no intercostal retractions, no accessory muscle use, and normal breath sounds.   Heart-- normal rate, regular rhythm, no murmur, and no gallop.   Abdomen--soft, non-tender, no distention, no masses, no HSM, no guarding, and no rigidity.    Extremities-- no pretibial edema bilaterally, normal femoral pulse B Rectal-- No external abnormalities noted. Normal sphincter tone. No rectal masses or tenderness. Brown stool  Prostate:  Hard to reach but does not seem enlarged, not toender Neurologic-- alert & oriented X3 and strength normal in all extremities. Psych-- Cognition and judgment appear intact. Alert and cooperative with normal attention span and concentration.  not anxious appearing and not depressed appearing.       Assessment & Plan:

## 2013-01-13 ENCOUNTER — Encounter: Payer: Self-pay | Admitting: Internal Medicine

## 2013-01-16 ENCOUNTER — Encounter: Payer: Self-pay | Admitting: *Deleted

## 2013-06-27 ENCOUNTER — Encounter: Payer: Self-pay | Admitting: Internal Medicine

## 2013-06-27 ENCOUNTER — Ambulatory Visit (INDEPENDENT_AMBULATORY_CARE_PROVIDER_SITE_OTHER): Payer: BC Managed Care – PPO | Admitting: Internal Medicine

## 2013-06-27 VITALS — BP 124/81 | HR 96 | Temp 99.6°F | Wt 232.0 lb

## 2013-06-27 DIAGNOSIS — R52 Pain, unspecified: Secondary | ICD-10-CM

## 2013-06-27 DIAGNOSIS — R05 Cough: Secondary | ICD-10-CM

## 2013-06-27 DIAGNOSIS — J111 Influenza due to unidentified influenza virus with other respiratory manifestations: Secondary | ICD-10-CM

## 2013-06-27 DIAGNOSIS — R059 Cough, unspecified: Secondary | ICD-10-CM

## 2013-06-27 LAB — POCT RAPID STREP A (OFFICE): Rapid Strep A Screen: NEGATIVE

## 2013-06-27 LAB — POCT INFLUENZA A/B: Influenza B, POC: POSITIVE

## 2013-06-27 MED ORDER — OSELTAMIVIR PHOSPHATE 75 MG PO CAPS: 75.0000 mg | ORAL_CAPSULE | Freq: Two times a day (BID) | ORAL | Status: DC

## 2013-06-27 NOTE — Progress Notes (Signed)
   Subjective:    Patient ID: Travis Greene, male    DOB: 07/13/1966, 47 y.o.   MRN: [id number]  HPI  Acute visit Sudden onset of symptoms 2 days ago: Fatigue, generalized aches, dry cough. Does not recall any sick contacts,not taking any medication in particularly for his symptoms.  Past Medical History  Diagnosis Date  . ED (erectile dysfunction) 11/09  . Ulcerative colitis Dx 2005    [facility], last Cscope 2010 Dr Oletta Lamas, was Rx lialda  . Chest pain     negative stress test 11/2009   Past Surgical History  Procedure Laterality Date  . Appendectomy    . Vasectomy     History   Social History  . Marital Status: Married    Spouse Name: N/A    Number of Children: 69  . Years of Education: N/A   Occupational History  .  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 Smoking status: Former Smoker -- 1.00 packs/day for 2 years    Types: Cigarettes    Quit date: 11/06/1997  . Smokeless tobacco: Never Used  . Alcohol Use: No  . Drug Use: No     Comment: in the past   . Sexual Activity: Not on file   Other Topics Concern  . Not on file   Social History Narrative   [name], from [place] w/ wife and 3 of his 5 children  '  Review of Systems + Subjective fevers, and chills. No  nausea, vomiting, diarrhea. No abdominal pain. No  Rash, mild headache.     Objective:   Physical Exam BP 124/81  Pulse 96  Temp(Src) 99.6 F (37.6 C)  Wt 232 lb (105.235 kg)  SpO2 98% General -- alert, well-developed, NAD, no toxic appearing  HEENT-- Not pale. TMs normal, throat symmetric w/ mild redness  and no discharge. Face symmetric, sinuses not tender to palpation. Nose congested.  Lungs -- normal respiratory effort, no intercostal retractions, no accessory muscle use, and normal breath sounds.  Heart-- normal rate, regular rhythm, no murmur.  Extremities-- no pretibial edema bilaterally  Neurologic--  alert & oriented X3. Speech normal, gait normal, strength  normal in all extremities.  Psych-- Cognition and judgment appear intact. Cooperative with normal attention span and concentration. No anxious appearing , no depressed appearing.     Assessment & Plan:  Influenza, New dx Patient presented with a viral syndrome, strep test returned negative, flu test came back positive. Diagnosis of influenza  d/w the patient, doesn't look toxic, he is quite contagious so precautions discussed. see instructions.

## 2013-06-27 NOTE — Progress Notes (Signed)
Pre visit review using our clinic review tool, if applicable. No additional management support is needed unless otherwise documented below in the visit note. 

## 2013-06-27 NOTE — Patient Instructions (Signed)
Rest, fluids , tylenol For cough, take Mucinex DM twice a day as needed  Start Tamiflu twice a day today Call if no better in few days Call anytime if the symptoms are severe

## 2013-11-24 ENCOUNTER — Ambulatory Visit (INDEPENDENT_AMBULATORY_CARE_PROVIDER_SITE_OTHER): Payer: BC Managed Care – PPO | Admitting: Physician Assistant

## 2013-11-24 VITALS — BP 122/74 | HR 62 | Temp 98.1°F | Resp 16 | Ht 71.0 in | Wt 236.0 lb

## 2013-11-24 DIAGNOSIS — B9789 Other viral agents as the cause of diseases classified elsewhere: Principal | ICD-10-CM

## 2013-11-24 DIAGNOSIS — J069 Acute upper respiratory infection, unspecified: Secondary | ICD-10-CM

## 2013-11-24 MED ORDER — BENZONATATE 100 MG PO CAPS: 100.0000 mg | ORAL_CAPSULE | Freq: Three times a day (TID) | ORAL | Status: DC | PRN

## 2013-11-24 MED ORDER — IPRATROPIUM BROMIDE 0.03 % NA SOLN: 2.0000 | Freq: Two times a day (BID) | NASAL | Status: DC

## 2013-11-24 NOTE — Patient Instructions (Signed)
Get plenty of rest and drink plenty of water.  Use the atrovent nasal spray daily in each nostril to make it easier for your sinuses to drain.  Use the tessalon to control your cough.  This medication may make you sleepy, if so, take it at night before bed.

## 2013-11-24 NOTE — Progress Notes (Signed)
   Subjective:    Patient ID: Travis Greene, male    DOB: Jun 16, 1966, 48 y.o.   MRN: [id number]  Sore Throat  Associated symptoms include coughing, headaches and trouble swallowing. Pertinent negatives include no congestion or shortness of breath.  Cough Associated symptoms include headaches, myalgias (generalized), rhinorrhea and a sore throat. Pertinent negatives include no postnasal drip, rash, shortness of breath or wheezing.    48y.o with hx of UC presents with 2 days of sore throat and cough.  Also woke up this morning with a hoarse voice.  Cough is nonproductive.  Associated HA, pain with swallowing, generalized body aches, and subjective fever.  Tylenol this morning has helped his HA.  Denies congestion, post nasal drip, sinus pressure, fever, N/V/D.  Pt states his children have had runny nose and cough, which started before his symptom onset.    Review of Systems  HENT: Positive for rhinorrhea, sore throat and trouble swallowing. Negative for congestion, postnasal drip and sinus pressure.   Eyes: Negative.   Respiratory: Positive for cough. Negative for shortness of breath and wheezing.   Cardiovascular: Negative.   Gastrointestinal: Negative.   Endocrine: Negative.   Genitourinary: Negative.   Musculoskeletal: Positive for myalgias (generalized).  Skin: Negative for color change and rash.  Allergic/Immunologic: Negative.   Neurological: Positive for headaches. Negative for dizziness, syncope, light-headedness and numbness.       Objective:   Physical Exam  Constitutional: He is oriented to person, place, and time. He appears well-developed and well-nourished. No distress.  BP 122/74  Pulse 62  Temp(Src) 98.1 F (36.7 C) (Oral)  Resp 16  Ht 5' 11"  (1.803 m)  Wt 236 lb (107.049 kg)  BMI 32.93 kg/m2  SpO2 99%   HENT:  Head: Normocephalic.  Right Ear: External ear normal.  Left Ear: External ear normal.  Nose: Mucosal edema present.  Mouth/Throat: Uvula is  midline. Posterior oropharyngeal erythema present. No oropharyngeal exudate.  Eyes: Conjunctivae are normal. Pupils are equal, round, and reactive to light.  Neck: Normal range of motion.  Cardiovascular: Normal rate, regular rhythm, normal heart sounds and intact distal pulses.  Exam reveals no gallop and no friction rub.   No murmur heard. Pulmonary/Chest: Effort normal and breath sounds normal. No respiratory distress. He has no wheezes.  Lymphadenopathy:    He has no cervical adenopathy.  Neurological: He is alert and oriented to person, place, and time.  Skin: Skin is warm and dry. No rash noted.  Psychiatric: He has a normal mood and affect. His behavior is normal.          Assessment & Plan:   1. Viral URI with cough Probable viral infection.  Pt will return if symptoms worsen. - benzonatate (TESSALON) 100 MG capsule; Take 1-2 capsules (100-200 mg total) by mouth 3 (three) times daily as needed for cough.  Dispense: 40 capsule; Refill: 0 - ipratropium (ATROVENT) 0.03 % nasal spray; Place 2 sprays into both nostrils 2 (two) times daily.  Dispense: 30 mL; Refill: 0

## 2013-11-25 NOTE — Progress Notes (Signed)
I have examined this patient along with the student and agree.  

## 2014-01-28 ENCOUNTER — Telehealth: Payer: Self-pay | Admitting: *Deleted

## 2014-01-28 NOTE — Telephone Encounter (Signed)
Medication List and allergies:  Reviewed and updated  90 Day supply/mail order:  Local prescriptions: [facility] [address]  Immunizations Due: PNA, Shingles  A/P FH, PSH, or Personal Hx: Reviewed and updated Flu vaccine: 05/2008 Tdap: 05/2007 PNA: Never Shingles: Never CCS:  PSA: 12/2012 0.39  To Discuss with Provider:  Not at this time

## 2014-01-30 ENCOUNTER — Ambulatory Visit (INDEPENDENT_AMBULATORY_CARE_PROVIDER_SITE_OTHER): Payer: BC Managed Care – PPO | Admitting: Internal Medicine

## 2014-01-30 ENCOUNTER — Encounter: Payer: Self-pay | Admitting: Internal Medicine

## 2014-01-30 VITALS — BP 113/76 | HR 73 | Temp 97.8°F | Ht 71.0 in | Wt 233.0 lb

## 2014-01-30 DIAGNOSIS — K5190 Ulcerative colitis, unspecified, without complications: Secondary | ICD-10-CM

## 2014-01-30 DIAGNOSIS — Z0000 Encounter for general adult medical examination without abnormal findings: Secondary | ICD-10-CM

## 2014-01-30 DIAGNOSIS — F411 Generalized anxiety disorder: Secondary | ICD-10-CM

## 2014-01-30 LAB — CBC WITH DIFFERENTIAL/PLATELET
BASOS ABS: 0 10*3/uL (ref 0.0–0.1)
BASOS PCT: 0.6 % (ref 0.0–3.0)
Eosinophils Absolute: 0.1 10*3/uL (ref 0.0–0.7)
Eosinophils Relative: 1.7 % (ref 0.0–5.0)
HCT: 44.5 % (ref 39.0–52.0)
Hemoglobin: 15.5 g/dL (ref 13.0–17.0)
LYMPHS PCT: 31 % (ref 12.0–46.0)
Lymphs Abs: 2.1 10*3/uL (ref 0.7–4.0)
MCHC: 34.9 g/dL (ref 30.0–36.0)
MCV: 91.8 fl (ref 78.0–100.0)
MONOS PCT: 10.7 % (ref 3.0–12.0)
Monocytes Absolute: 0.7 10*3/uL (ref 0.1–1.0)
Neutro Abs: 3.8 10*3/uL (ref 1.4–7.7)
Neutrophils Relative %: 56 % (ref 43.0–77.0)
Platelets: 252 10*3/uL (ref 150.0–400.0)
RBC: 4.84 Mil/uL (ref 4.22–5.81)
RDW: 12.8 % (ref 11.5–15.5)
WBC: 6.8 10*3/uL (ref 4.0–10.5)

## 2014-01-30 LAB — LIPID PANEL
Cholesterol: 231 mg/dL — ABNORMAL HIGH (ref 0–200)
HDL: 53.8 mg/dL (ref >39.00)
LDL CALC: 153 mg/dL — AB (ref 0–99)
NonHDL: 177.2
TRIGLYCERIDES: 121 mg/dL (ref 0.0–149.0)
Total CHOL/HDL Ratio: 4
VLDL: 24.2 mg/dL (ref 0.0–40.0)

## 2014-01-30 LAB — COMPREHENSIVE METABOLIC PANEL
ALT: 35 U/L (ref 0–53)
AST: 28 U/L (ref 0–37)
Albumin: 4 g/dL (ref 3.5–5.2)
Alkaline Phosphatase: 53 U/L (ref 39–117)
BUN: 14 mg/dL (ref 6–23)
CALCIUM: 9 mg/dL (ref 8.4–10.5)
CHLORIDE: 101 meq/L (ref 96–112)
CO2: 28 meq/L (ref 19–32)
CREATININE: 1 mg/dL (ref 0.4–1.5)
GFR: 87 mL/min (ref >60.00)
Glucose, Bld: 100 mg/dL — ABNORMAL HIGH (ref 70–99)
Potassium: 3.7 mEq/L (ref 3.5–5.1)
Sodium: 136 mEq/L (ref 135–145)
Total Bilirubin: 1 mg/dL (ref 0.2–1.2)
Total Protein: 6.9 g/dL (ref 6.0–8.3)

## 2014-01-30 LAB — PSA: PSA: 0.49 ng/mL (ref 0.10–4.00)

## 2014-01-30 LAB — TSH: TSH: 0.63 u[IU]/mL (ref 0.35–4.50)

## 2014-01-30 NOTE — Patient Instructions (Addendum)
Get your blood work before you leave   Will fax results to Dr Oletta Lamas   Next visit is for a physical exam in 1 year, fasting Please make an appointment     At some point this year the clinic will relocate to  [place],  corner of [address] [place] and [address] (10 minutes form here)  [place]  [place], [place] [address] [phone]

## 2014-01-30 NOTE — Progress Notes (Signed)
Pre visit review using our clinic review tool, if applicable. No additional management support is needed unless otherwise documented below in the visit note. 

## 2014-01-30 NOTE — Assessment & Plan Note (Signed)
Sees GI q year (per pt), next f/u next week

## 2014-01-30 NOTE — Assessment & Plan Note (Signed)
Not an issue at this time, reports he feels great

## 2014-01-30 NOTE — Progress Notes (Signed)
   Subjective:    Patient ID: Travis Greene, male    DOB: May 03, 1966, 48 y.o.   MRN: [id number]  DOS:  01/30/2014 Type of  Visit: CPX History: feels well   ROS Diet-- trying to eat healthy Exercise-- trying to walks most days ~ 45 min No  CP, SOB Denies  nausea, vomiting diarrhea, blood in the stools No abdominal pain No GERD  Sx. No dysphagia or odynophagia (-) cough, sputum production (-) wheezing, chest congestion No dysuria, gross hematuria, difficulty urinating  No anxiety, depression    Past Medical History  Diagnosis Date  . ED (erectile dysfunction) 11/09  . Ulcerative colitis Dx 2005    [facility], last Cscope 2010 Dr Oletta Lamas, was Rx lialda  . Chest pain     negative stress test 11/2009  . Hyperthyroidism 12/30/2011    Past Surgical History  Procedure Laterality Date  . Appendectomy    . Vasectomy      History   Social History  . Marital Status: Married    Spouse Name: N/A    Number of Children: 76  . Years of Education: N/A   Occupational History  .  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 Smoking status: Former Smoker -- 1.00 packs/day for 2 years    Types: Cigarettes    Quit date: 11/06/1997  . Smokeless tobacco: Never Used  . Alcohol Use: Yes     Comment: socially  . Drug Use: No     Comment: in the past   . Sexual Activity: Not on file   Other Topics Concern  . Not on file   Social History Narrative   [name], from [place] w/ wife and 3 of his 5 children     Family History  Problem Relation Age of Onset  . Diabetes Mother   . Hypertension Mother     ?  . Coronary artery disease Neg Hx   . Prostate cancer Neg Hx   . Colon cancer Neg Hx        Medication List       This list is accurate as of: 01/30/14  1:03 PM.  Always use your most recent med list.               mesalamine 1.2 G EC tablet  Commonly known as:  LIALDA  Take 1,200 mg by mouth 4 (four) times daily.           Objective:   Physical  Exam BP 113/76  Pulse 73  Temp(Src) 97.8 F (36.6 C)  Ht 5' 11"  (1.803 m)  Wt 233 lb (105.688 kg)  BMI 32.51 kg/m2  SpO2 97% General -- alert, well-developed, NAD.  Neck --no thyromegaly  HEENT-- Not pale.  Lungs -- normal respiratory effort, no intercostal retractions, no accessory muscle use, and normal breath sounds.  Heart-- normal rate, regular rhythm, no murmur.  Abdomen-- Not distended, good bowel sounds,soft, non-tender. Rectal-- No external abnormalities noted. Normal sphincter tone. No rectal masses or tenderness. No stools found  Prostate-- very hard to reach gland , exam limited but normal Extremities-- no pretibial edema bilaterally  Neurologic--  alert & oriented X3. Speech normal, gait appropriate for age, strength symmetric and appropriate for age.   Psych-- Cognition and judgment appear intact. Cooperative with normal attention span and concentration. No anxious or depressed appearing.        Assessment & Plan:

## 2014-01-30 NOTE — Assessment & Plan Note (Addendum)
Td 08 Trying to do well w/ diet, exercise. Counseled  Labs.  Cscope per GI

## 2014-01-31 ENCOUNTER — Encounter: Payer: Self-pay | Admitting: *Deleted

## 2014-01-31 NOTE — Progress Notes (Signed)
Letter sent.

## 2014-02-27 ENCOUNTER — Ambulatory Visit (INDEPENDENT_AMBULATORY_CARE_PROVIDER_SITE_OTHER): Payer: BC Managed Care – PPO | Admitting: Podiatry

## 2014-02-27 ENCOUNTER — Encounter: Payer: Self-pay | Admitting: Podiatry

## 2014-02-27 VITALS — BP 167/88 | HR 67 | Resp 17

## 2014-02-27 DIAGNOSIS — L259 Unspecified contact dermatitis, unspecified cause: Secondary | ICD-10-CM

## 2014-02-27 DIAGNOSIS — L600 Ingrowing nail: Secondary | ICD-10-CM

## 2014-02-27 NOTE — Patient Instructions (Signed)

## 2014-02-27 NOTE — Progress Notes (Signed)
Subjective:     Patient ID: Travis Greene, male   DOB: September 29, 1965, 48 y.o.   MRN: [id number]  HPI patient presents with severe deformity of the left hallux nail that he can no longer cut and it's been getting sore in both corners and the top of the nail. Has also tried to trim it himself   Review of Systems  All other systems reviewed and are negative.      Objective:   Physical Exam  Nursing note and vitals reviewed. Constitutional: He is oriented to person, place, and time.  Cardiovascular: Intact distal pulses.   Musculoskeletal: Normal range of motion.  Neurological: He is oriented to person, place, and time.  Skin: Skin is warm.   neurovascular status intact with muscle strength adequate and range of motion subtalar midtarsal joint within normal limits. Patient's found to have severe deformity of the left hallux nail with incurvation medial lateral and dorsal. Also presents stating that he's got a rash on top of his feet which I noted in the second and third web spaces of both feet localized in nature     Assessment:     Damage left big toenail of the chronic nature along with chronic dermatitis of both feet and probable skin fungal infection    Plan:    H&P and condition reviewed. Due to damage of nail I've recommended removal and today I infiltrated 60 mg I can Marcaine mixture remove the nail bed exposed matrix and applied phenol 5 applications 30 seconds followed by alcohol lavaged and sterile dressing area and recommended topical Lamisil over-the-counter for the fungal infection and reappoint for recheck as needed

## 2014-02-27 NOTE — Progress Notes (Signed)
   Subjective:    Patient ID: Travis Greene, male    DOB: 21-Jan-1966, 48 y.o.   MRN: [id number]  HPI  Pt presents with swollen red painful left great toenail. Also has red itchy rash on bilateral feet, had both symptoms for 2 months now.  Review of Systems  All other systems reviewed and are negative.      Objective:   Physical Exam        Assessment & Plan:

## 2014-04-04 ENCOUNTER — Telehealth: Payer: Self-pay | Admitting: *Deleted

## 2014-04-04 NOTE — Telephone Encounter (Signed)
I called and informed her, his wife- Joelene Millin, that the medicine is over the counter, we didn't give him a prescription.   She said he sent me a picture, it was triple antibiotic ointment.  I told her that can be purchased over the counter.  I told her that he is supposed to be using Lamisil over the counter too for fungus.  She stated okay thank you.

## 2014-04-04 NOTE — Telephone Encounter (Signed)
He wants to get a cream ointment refill if possible.  If you could please give me a call back.

## 2014-09-24 ENCOUNTER — Encounter: Payer: Self-pay | Admitting: Internal Medicine

## 2014-09-24 ENCOUNTER — Ambulatory Visit (INDEPENDENT_AMBULATORY_CARE_PROVIDER_SITE_OTHER): Payer: BLUE CROSS/BLUE SHIELD | Admitting: Internal Medicine

## 2014-09-24 VITALS — BP 121/88 | HR 92 | Temp 98.1°F | Ht 71.0 in | Wt 241.1 lb

## 2014-09-24 DIAGNOSIS — K5190 Ulcerative colitis, unspecified, without complications: Secondary | ICD-10-CM

## 2014-09-24 LAB — CBC WITH DIFFERENTIAL/PLATELET
BASOS PCT: 0.5 % (ref 0.0–3.0)
Basophils Absolute: 0 10*3/uL (ref 0.0–0.1)
EOS PCT: 3.2 % (ref 0.0–5.0)
Eosinophils Absolute: 0.2 10*3/uL (ref 0.0–0.7)
HCT: 45.3 % (ref 39.0–52.0)
Hemoglobin: 15.6 g/dL (ref 13.0–17.0)
LYMPHS ABS: 1.7 10*3/uL (ref 0.7–4.0)
Lymphocytes Relative: 31 % (ref 12.0–46.0)
MCHC: 34.5 g/dL (ref 30.0–36.0)
MCV: 91.2 fl (ref 78.0–100.0)
MONO ABS: 0.8 10*3/uL (ref 0.1–1.0)
Monocytes Relative: 14.5 % — ABNORMAL HIGH (ref 3.0–12.0)
NEUTROS ABS: 2.8 10*3/uL (ref 1.4–7.7)
Neutrophils Relative %: 50.8 % (ref 43.0–77.0)
PLATELETS: 219 10*3/uL (ref 150.0–400.0)
RBC: 4.96 Mil/uL (ref 4.22–5.81)
RDW: 12.4 % (ref 11.5–15.5)
WBC: 5.4 10*3/uL (ref 4.0–10.5)

## 2014-09-24 LAB — COMPREHENSIVE METABOLIC PANEL
ALBUMIN: 4 g/dL (ref 3.5–5.2)
ALK PHOS: 65 U/L (ref 39–117)
ALT: 29 U/L (ref 0–53)
AST: 22 U/L (ref 0–37)
BUN: 13 mg/dL (ref 6–23)
CALCIUM: 9.1 mg/dL (ref 8.4–10.5)
CHLORIDE: 107 meq/L (ref 96–112)
CO2: 23 mEq/L (ref 19–32)
Creatinine, Ser: 0.79 mg/dL (ref 0.40–1.50)
GFR: 109.96 mL/min (ref >60.00)
Glucose, Bld: 93 mg/dL (ref 70–99)
POTASSIUM: 3.8 meq/L (ref 3.5–5.1)
SODIUM: 137 meq/L (ref 135–145)
Total Bilirubin: 0.4 mg/dL (ref 0.2–1.2)
Total Protein: 7 g/dL (ref 6.0–8.3)

## 2014-09-24 NOTE — Patient Instructions (Signed)
Get your blood work before you leave   Follow a bland diet  If you're not improving in the next 2-3 days or if you have fever, chills, diarrhea, blood in the stools: Call me or your stomach doctor

## 2014-09-24 NOTE — Progress Notes (Signed)
   Subjective:    Patient ID: Travis Greene, male    DOB: June 17, 1966, 49 y.o.   MRN: [id number]  DOS:  09/24/2014 Type of visit - description : acute Interval history: Symptoms started a week ago with the ill-defined lower abdominal discomfort, not colicky, "burning-like". Symptoms are associated with mild nausea, also when he has the discomfort he gets very anxious. No recent antibiotics Recent trip:  came back from [place] 3 days ago. Has a history of ulcerative colitis, having 2 bowel movements daily which is his usual.   Review of Systems  Denies fever chills No change in the color of the stools, vomiting, blood in the stools. No GERD type of symptoms No dysuria, gross hematuria difficulty urinating  Past Medical History  Diagnosis Date  . ED (erectile dysfunction) 11/09  . Ulcerative colitis Dx 2005    [facility], last Cscope 2010 Dr Oletta Lamas, was Rx lialda  . Chest pain     negative stress test 11/2009  . Hyperthyroidism 12/30/2011    Past Surgical History  Procedure Laterality Date  . Appendectomy    . Vasectomy      History   Social History  . Marital Status: Married    Spouse Name: N/A  . Number of Children: 5  . Years of Education: N/A   Occupational History  .  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 Smoking status: Former Smoker -- 1.00 packs/day for 2 years    Types: Cigarettes    Quit date: 11/06/1997  . Smokeless tobacco: Never Used  . Alcohol Use: Yes     Comment: socially  . Drug Use: No     Comment: in the past   . Sexual Activity: Not on file   Other Topics Concern  . Not on file   Social History Narrative   [name], from [place] w/ wife and 3 of his 5 children        Medication List       This list is accurate as of: 09/24/14 11:59 PM.  Always use your most recent med list.               mesalamine 1.2 G EC tablet  Commonly known as:  LIALDA  Take 1,200 mg by mouth 4 (four) times daily.             Objective:   Physical Exam BP 121/88 mmHg  Pulse 92  Temp(Src) 98.1 F (36.7 C) (Oral)  Ht 5' 11"  (1.803 m)  Wt 241 lb 2 oz (109.374 kg)  BMI 33.65 kg/m2  SpO2 98% General:   Well developed, well nourished . NAD.  HEENT:  Normocephalic . Face symmetric, atraumatic; not pale or jaundice Lungs:  CTA B Normal respiratory effort, no intercostal retractions, no accessory muscle use. Heart: RRR,  no murmur.  Abdomen:  Not distended, soft, non-tender. No rebound or rigidity. No mass,organomegaly Muscle skeletal: no pretibial edema bilaterally  Neurologic:  alert & oriented X3.  Speech normal, gait appropriate for age and unassisted Psych--  Cognition and judgment appear intact.  Cooperative with normal attention span and concentration.  Behavior appropriate. No anxious or depressed appearing.      Assessment & Plan:

## 2014-09-24 NOTE — Progress Notes (Signed)
Pre visit review using our clinic review tool, if applicable. No additional management support is needed unless otherwise documented below in the visit note. 

## 2014-09-26 NOTE — Assessment & Plan Note (Signed)
  Gentleman with history of ulcerative colitis with one week history of lower abdominal discomfort not associated with diarrhea or blood in the stools. Exam is quite benign. Symptoms started while he was taking a trip in [place]. At this point I will check CMP and CBC, if labs are okay will recommend observation for now. See instructions.

## 2014-10-16 ENCOUNTER — Encounter: Payer: Self-pay | Admitting: Internal Medicine

## 2014-10-16 ENCOUNTER — Ambulatory Visit (INDEPENDENT_AMBULATORY_CARE_PROVIDER_SITE_OTHER): Payer: BLUE CROSS/BLUE SHIELD | Admitting: Internal Medicine

## 2014-10-16 VITALS — BP 128/72 | HR 70 | Temp 98.0°F | Ht 71.0 in | Wt 238.1 lb

## 2014-10-16 DIAGNOSIS — L309 Dermatitis, unspecified: Secondary | ICD-10-CM

## 2014-10-16 MED ORDER — BETAMETHASONE DIPROPIONATE AUG 0.05 % EX CREA: TOPICAL_CREAM | Freq: Two times a day (BID) | CUTANEOUS | Status: DC

## 2014-10-16 MED ORDER — KETOCONAZOLE 2 % EX CREA: 1.0000 "application " | TOPICAL_CREAM | Freq: Two times a day (BID) | CUTANEOUS | Status: DC

## 2014-10-16 NOTE — Progress Notes (Signed)
Pre visit review using our clinic review tool, if applicable. No additional management support is needed unless otherwise documented below in the visit note. 

## 2014-10-16 NOTE — Patient Instructions (Signed)
Betamethasone twice a day for 3 or 4 weeks Ketoconazole, apply between the toes twice a day  If not improving, please let me know. Symptoms may return sporadically

## 2014-10-16 NOTE — Progress Notes (Signed)
   Subjective:    Patient ID: Travis Greene, male    DOB: 1966-02-18, 49 y.o.   MRN: [id number]  DOS:  10/16/2014 Type of visit - description : ACUTE Interval history: 6 months history of rash at the feet, + itching, blisters, swelling. Not much improving with OTC Neosporin. He recalls that in the past, years ago, had similar symptoms in the same location.   Review of Systems Denies fever or chills, no purulent discharge but occasionally he oozes clear discharge  Past Medical History  Diagnosis Date  . ED (erectile dysfunction) 11/09  . Ulcerative colitis Dx 2005    [facility], last Cscope 2010 Dr Oletta Lamas, was Rx lialda  . Chest pain     negative stress test 11/2009  . Hyperthyroidism 12/30/2011    Past Surgical History  Procedure Laterality Date  . Appendectomy    . Vasectomy      History   Social History  . Marital Status: Married    Spouse Name: N/A  . Number of Children: 5  . Years of Education: N/A   Occupational History  .  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 Smoking status: Former Smoker -- 1.00 packs/day for 2 years    Types: Cigarettes    Quit date: 11/06/1997  . Smokeless tobacco: Never Used  . Alcohol Use: Yes     Comment: socially  . Drug Use: No     Comment: in the past   . Sexual Activity: Not on file   Other Topics Concern  . Not on file   Social History Narrative   [name], from [place] w/ wife and 3 of his 5 children        Medication List       This list is accurate as of: 10/16/14 11:59 PM.  Always use your most recent med list.               augmented betamethasone dipropionate 0.05 % cream  Commonly known as:  DIPROLENE AF  Apply topically 2 (two) times daily.     ketoconazole 2 % cream  Commonly known as:  NIZORAL  Apply 1 application topically 2 (two) times daily.     mesalamine 1.2 G EC tablet  Commonly known as:  LIALDA  Take 1,200 mg by mouth 4 (four) times daily.           Objective:     Physical Exam  Constitutional: He appears well-developed and well-nourished. No distress.  Musculoskeletal:       Feet:  Skin: Skin is warm and dry. He is not diaphoretic.  Psychiatric: He has a normal mood and affect. His behavior is normal. Judgment and thought content normal.   BP 128/72 mmHg  Pulse 70  Temp(Src) 98 F (36.7 C) (Oral)  Ht 5' 11"  (0.511 m)  Wt 238 lb 2 oz (108.013 kg)  BMI 33.23 kg/m2  SpO2 96%       Assessment & Plan:

## 2014-10-17 DIAGNOSIS — L309 Dermatitis, unspecified: Secondary | ICD-10-CM | POA: Insufficient documentation

## 2014-10-17 NOTE — Assessment & Plan Note (Signed)
Findings most likely due to eczema. Some fungal infection between the toes. Plan:  Ketoconazole between the toes Betamethasone consistently twice a day for 3-4 weeks for eczema.

## 2014-10-24 ENCOUNTER — Telehealth: Payer: Self-pay

## 2014-10-24 MED ORDER — KETOCONAZOLE 2 % EX CREA: 1.0000 "application " | TOPICAL_CREAM | Freq: Two times a day (BID) | CUTANEOUS | Status: DC

## 2014-10-24 NOTE — Telephone Encounter (Signed)
Okay 15 g and one refill

## 2014-10-24 NOTE — Telephone Encounter (Signed)
Rx sent to [facility].

## 2014-10-24 NOTE — Telephone Encounter (Signed)
Pt is requesting refill on Ketoconazole 2 % cream 15GM.   Last OV: 10/16/2014 Last Fill: 10/16/2014 #30g 0RF  Okay to refill?

## 2014-11-08 ENCOUNTER — Other Ambulatory Visit: Payer: Self-pay | Admitting: Internal Medicine

## 2014-11-25 ENCOUNTER — Other Ambulatory Visit: Payer: Self-pay | Admitting: Family Medicine

## 2014-11-25 NOTE — Telephone Encounter (Signed)
Ok x 1

## 2014-11-25 NOTE — Telephone Encounter (Signed)
Pt is requesting refill on Ketoconazole cream. Okay to refill?

## 2014-12-16 ENCOUNTER — Other Ambulatory Visit: Payer: Self-pay | Admitting: Internal Medicine

## 2014-12-16 NOTE — Telephone Encounter (Signed)
Rx sent to pharmacy   

## 2014-12-16 NOTE — Telephone Encounter (Signed)
Ok x 2  

## 2014-12-16 NOTE — Telephone Encounter (Signed)
Pt is requesting refill on Ketoconazole cream.  Last OV: 10/16/2014 Last Fill: 11/25/2014 # 15g 0RF  Please advise.

## 2015-01-16 ENCOUNTER — Ambulatory Visit (INDEPENDENT_AMBULATORY_CARE_PROVIDER_SITE_OTHER): Payer: BLUE CROSS/BLUE SHIELD | Admitting: Internal Medicine

## 2015-01-16 ENCOUNTER — Encounter: Payer: Self-pay | Admitting: Internal Medicine

## 2015-01-16 ENCOUNTER — Telehealth: Payer: Self-pay | Admitting: Internal Medicine

## 2015-01-16 VITALS — BP 108/72 | HR 66 | Temp 97.8°F | Ht 71.0 in | Wt 241.1 lb

## 2015-01-16 DIAGNOSIS — L309 Dermatitis, unspecified: Secondary | ICD-10-CM | POA: Diagnosis not present

## 2015-01-16 NOTE — Telephone Encounter (Signed)
Pre visit letter sent

## 2015-01-16 NOTE — Progress Notes (Signed)
Pre visit review using our clinic review tool, if applicable. No additional management support is needed unless otherwise documented below in the visit note. 

## 2015-01-16 NOTE — Assessment & Plan Note (Signed)
Pruritic rash on the feet consistent with eczema but has not responded to high doses of topical steroid Plan: Stop all creams Refer to dermatology

## 2015-01-16 NOTE — Patient Instructions (Signed)
Will refer you to dermatology  Front desk: please get a sooner appointment for a CPX, ok to put two 15 min appointments

## 2015-01-16 NOTE — Progress Notes (Signed)
   Subjective:    Patient ID: Travis Greene, male    DOB: [birth year], 49 y.o.   MRN: [id number]  DOS:  01/16/2015 Type of visit - description : acute Interval history: Rash is unchanged despite using medications as prescribed   Review of Systems   Past Medical History  Diagnosis Date  . ED (erectile dysfunction) 11/09  . Ulcerative colitis Dx 2005    [facility], last Cscope 2010 Dr Oletta Lamas, was Rx lialda  . Chest pain     negative stress test 11/2009  . Hyperthyroidism 12/30/2011    Past Surgical History  Procedure Laterality Date  . Appendectomy    . Vasectomy      History   Social History  . Marital Status: Married    Spouse Name: N/A  . Number of Children: 5  . Years of Education: N/A   Occupational History  .  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 Smoking status: Former Smoker -- 1.00 packs/day for 2 years    Types: Cigarettes    Quit date: 11/06/1997  . Smokeless tobacco: Never Used  . Alcohol Use: Yes     Comment: socially  . Drug Use: No     Comment: in the past   . Sexual Activity: Not on file   Other Topics Concern  . Not on file   Social History Narrative   [name], from [place] w/ wife and 3 of his 5 children        Medication List       This list is accurate as of: 01/16/15 11:59 PM.  Always use your most recent med list.               ketoconazole 2 % cream  Commonly known as:  NIZORAL  Apply topically 2 (two) times daily.     mesalamine 1.2 G EC tablet  Commonly known as:  LIALDA  Take 1,200 mg by mouth 4 (four) times daily.           Objective:   Physical Exam  Constitutional: He appears well-developed and well-nourished.  Musculoskeletal:       Feet:  Psychiatric: He has a normal mood and affect. Judgment and thought content normal.   BP 108/72 mmHg  Pulse 66  Temp(Src) 97.8 F (36.6 C) (Oral)  Ht 5' 11"  (1.803 m)  Wt 241 lb 2 oz (109.374 kg)  BMI 33.65 kg/m2  SpO2 99%         Assessment & Plan:

## 2015-01-20 ENCOUNTER — Encounter: Payer: Self-pay | Admitting: Internal Medicine

## 2015-02-07 ENCOUNTER — Encounter: Payer: BLUE CROSS/BLUE SHIELD | Admitting: Internal Medicine

## 2015-03-29 ENCOUNTER — Ambulatory Visit (INDEPENDENT_AMBULATORY_CARE_PROVIDER_SITE_OTHER): Payer: BLUE CROSS/BLUE SHIELD | Admitting: Family Medicine

## 2015-03-29 VITALS — BP 114/82 | HR 71 | Temp 98.3°F | Ht 71.0 in | Wt 235.4 lb

## 2015-03-29 DIAGNOSIS — L03114 Cellulitis of left upper limb: Secondary | ICD-10-CM | POA: Diagnosis not present

## 2015-03-29 MED ORDER — DOXYCYCLINE HYCLATE 100 MG PO TABS: 100.0000 mg | ORAL_TABLET | Freq: Two times a day (BID) | ORAL | Status: DC

## 2015-03-29 NOTE — Patient Instructions (Signed)

## 2015-03-29 NOTE — Progress Notes (Signed)
@UMFCLOGO @  This chart was scribed for Robyn Haber, MD by Thea Alken, ED Scribe. This patient was seen in room 8 and the patient's care was started at 10:04 AM.   Patient ID: Travis Greene MRN: [id number], DOB: [birth year], 49 y.o. Date of Encounter: 03/29/2015, 10:02 AM  Primary Physician: Kathlene November, MD  Chief Complaint:  Chief Complaint  Patient presents with   Arm Pain    and redness in the left axillary area x's 3 days    HPI: 49 y.o. year old male with history below presents with boil to left axilla that began 3 days ago. Pt reports he noticed redness and swelling to left axilla 3 days ago that has gradually worsened with soreness. Pt works in [employer], production.   Past Medical History  Diagnosis Date   ED (erectile dysfunction) 11/09   Ulcerative colitis Dx 2005    [facility], last Cscope 2010 Dr Oletta Lamas, was Rx lialda   Chest pain     negative stress test 11/2009   Hyperthyroidism 12/30/2011    Home Meds: Prior to Admission medications   Medication Sig Start Date End Date Taking? Authorizing Provider  ketoconazole (NIZORAL) 2 % cream Apply topically 2 (two) times daily. 12/16/14  Yes Colon Branch, MD  mesalamine (LIALDA) 1.2 G EC tablet Take 1,200 mg by mouth 4 (four) times daily.   Yes Historical Provider, MD   Allergies: No Known Allergies  Social History   Social History   Marital Status: Married    Spouse Name: N/A   Number of Children: 5   Years of Education: N/A   Occupational History    Building control surveyor     Social History Main Topics   Smoking status: Former Smoker -- 1.00 packs/day for 2 years    Types: Cigarettes    Quit date: 11/06/1997   Smokeless tobacco: Never Used   Alcohol Use: Yes     Comment: socially   Drug Use: No     Comment: in the past    Sexual Activity: Not on file   Other Topics Concern   Not on file   Social History Narrative   [name], from [place] w/ wife and 3 of his 5 children      Review of Systems: Constitutional: negative for chills, fever, night sweats, weight changes, or fatigue  HEENT: negative for vision changes, hearing loss, congestion, rhinorrhea, ST, epistaxis, or sinus pressure Cardiovascular: negative for chest pain or palpitations Respiratory: negative for hemoptysis, wheezing, shortness of breath, or cough Abdominal: negative for abdominal pain, nausea, vomiting, diarrhea, or constipation Dermatological: negative for rash Neurologic: negative for headache, dizziness, or syncope All other systems reviewed and are otherwise negative with the exception to those above and in the HPI.   Physical Exam: Blood pressure 114/82, pulse 71, temperature 98.3 F (36.8 C), temperature source Oral, height 5' 11"  (1.803 m), weight 235 lb 6.4 oz (106.777 kg), SpO2 96 %., Body mass index is 32.85 kg/(m^2). General: Well developed, well nourished, in no acute distress. Head: Normocephalic, atraumatic, eyes without discharge, sclera non-icteric, nares are without discharge. Bilateral auditory canals clear, TM's are without perforation, pearly grey and translucent with reflective cone of light bilaterally. Oral cavity moist, posterior pharynx without exudate, erythema, peritonsillar abscess, or post nasal drip.  Neck: Supple. No thyromegaly. Full ROM. No lymphadenopathy. Lungs: Clear bilaterally to auscultation without wheezes, rales, or rhonchi. Breathing is unlabored. Heart: RRR with S1 S2. No murmurs, rubs, or gallops  appreciated. Abdomen: Soft, non-tender, non-distended with normoactive bowel sounds. No hepatomegaly. No rebound/guarding. No obvious abdominal masses. Msk:  Strength and tone normal for age. Extremities/Skin: Warm and dry. No clubbing or cyanosis. No edema. No rashes or suspicious lesions. 5 cm area of erythema under left arm just distal to axilla with central .5 cm of induration in fluctuance.  Neuro: Alert and oriented X 3. Moves all extremities  spontaneously. Gait is normal. CNII-XII grossly in tact. Psych:  Responds to questions appropriately with a normal affect.    ASSESSMENT AND PLAN:  49 y.o. year old male with   1. Cellulitis of arm, left    Meds ordered this encounter  Medications   doxycycline (VIBRA-TABS) 100 MG tablet    Sig: Take 1 tablet (100 mg total) by mouth 2 (two) times daily.    Dispense:  20 tablet    Refill:  0   This chart was scribed in my presence and reviewed by me personally.    ICD-9-CM ICD-10-CM   1. Cellulitis of arm, left 682.3 L03.114 doxycycline (VIBRA-TABS) 100 MG tablet     Signed, Robyn Haber, MD  Signed, Robyn Haber, MD 03/29/2015 10:02 AM

## 2015-05-15 ENCOUNTER — Encounter: Payer: BLUE CROSS/BLUE SHIELD | Admitting: Internal Medicine

## 2015-07-25 ENCOUNTER — Encounter: Payer: Self-pay | Admitting: Behavioral Health

## 2015-07-25 ENCOUNTER — Telehealth: Payer: Self-pay | Admitting: Behavioral Health

## 2015-07-25 NOTE — Telephone Encounter (Signed)
Pre-Visit Call completed with patient and chart updated.   Pre-Visit Info documented in Specialty Comments under [employer].    

## 2015-07-29 ENCOUNTER — Encounter: Payer: BLUE CROSS/BLUE SHIELD | Admitting: Internal Medicine

## 2015-07-30 ENCOUNTER — Encounter: Payer: BLUE CROSS/BLUE SHIELD | Admitting: Internal Medicine

## 2015-08-22 ENCOUNTER — Telehealth: Payer: Self-pay

## 2015-08-22 NOTE — Telephone Encounter (Signed)
Noted  

## 2015-08-22 NOTE — Telephone Encounter (Signed)
FYI

## 2015-08-22 NOTE — Telephone Encounter (Signed)
-----   Message from Nani Ravens sent at 08/22/2015  4:51 PM EST ----- Regarding: FYI  Just a FYI, when rescheduling pt Travis Greene  DOB: [birth year] he was very upset about the reschedule. He says that this is the second time that PCP has rescheduled due to a meeting. He says that he feels like he isn't getting good service. I was very apologetic but pt seemed to still be upset. Pt had a preference of an 8 appt so I put two 15 minute slots together to get pt in in April.

## 2015-08-27 ENCOUNTER — Encounter: Payer: BLUE CROSS/BLUE SHIELD | Admitting: Internal Medicine

## 2015-11-26 ENCOUNTER — Telehealth: Payer: Self-pay | Admitting: Behavioral Health

## 2015-11-26 NOTE — Telephone Encounter (Signed)
Unable to reach patient at time of Pre-Visit Call.  Left message for patient to return call when available.    

## 2015-11-27 ENCOUNTER — Encounter: Payer: Self-pay | Admitting: Internal Medicine

## 2015-11-27 ENCOUNTER — Ambulatory Visit (INDEPENDENT_AMBULATORY_CARE_PROVIDER_SITE_OTHER): Payer: BLUE CROSS/BLUE SHIELD | Admitting: Internal Medicine

## 2015-11-27 VITALS — BP 114/72 | HR 80 | Temp 98.1°F | Ht 68.0 in | Wt 231.6 lb

## 2015-11-27 DIAGNOSIS — Z0000 Encounter for general adult medical examination without abnormal findings: Secondary | ICD-10-CM

## 2015-11-27 DIAGNOSIS — Z09 Encounter for follow-up examination after completed treatment for conditions other than malignant neoplasm: Secondary | ICD-10-CM

## 2015-11-27 LAB — LIPID PANEL
Cholesterol: 187 mg/dL (ref 0–200)
HDL: 50.2 mg/dL (ref >39.00)
LDL Cholesterol: 119 mg/dL — ABNORMAL HIGH (ref 0–99)
NonHDL: 137
TRIGLYCERIDES: 90 mg/dL (ref 0.0–149.0)
Total CHOL/HDL Ratio: 4
VLDL: 18 mg/dL (ref 0.0–40.0)

## 2015-11-27 LAB — VITAMIN B12: Vitamin B-12: 948 pg/mL — ABNORMAL HIGH (ref 211–911)

## 2015-11-27 LAB — FOLATE: FOLATE: 15.3 ng/mL (ref >5.9)

## 2015-11-27 LAB — COMPREHENSIVE METABOLIC PANEL
ALBUMIN: 4 g/dL (ref 3.5–5.2)
ALK PHOS: 59 U/L (ref 39–117)
ALT: 29 U/L (ref 0–53)
AST: 17 U/L (ref 0–37)
BILIRUBIN TOTAL: 0.5 mg/dL (ref 0.2–1.2)
BUN: 18 mg/dL (ref 6–23)
CO2: 24 mEq/L (ref 19–32)
Calcium: 8.9 mg/dL (ref 8.4–10.5)
Chloride: 106 mEq/L (ref 96–112)
Creatinine, Ser: 0.86 mg/dL (ref 0.40–1.50)
GFR: 99.24 mL/min (ref >60.00)
Glucose, Bld: 102 mg/dL — ABNORMAL HIGH (ref 70–99)
Potassium: 3.9 mEq/L (ref 3.5–5.1)
SODIUM: 137 meq/L (ref 135–145)
TOTAL PROTEIN: 7.2 g/dL (ref 6.0–8.3)

## 2015-11-27 LAB — CBC WITH DIFFERENTIAL/PLATELET
Basophils Absolute: 0 10*3/uL (ref 0.0–0.1)
Basophils Relative: 0.6 % (ref 0.0–3.0)
EOS ABS: 0.1 10*3/uL (ref 0.0–0.7)
EOS PCT: 2.3 % (ref 0.0–5.0)
HCT: 43 % (ref 39.0–52.0)
Hemoglobin: 14.8 g/dL (ref 13.0–17.0)
LYMPHS ABS: 2.4 10*3/uL (ref 0.7–4.0)
Lymphocytes Relative: 40 % (ref 12.0–46.0)
MCHC: 34.4 g/dL (ref 30.0–36.0)
MCV: 92.3 fl (ref 78.0–100.0)
MONO ABS: 0.6 10*3/uL (ref 0.1–1.0)
Monocytes Relative: 10.2 % (ref 3.0–12.0)
NEUTROS PCT: 46.9 % (ref 43.0–77.0)
Neutro Abs: 2.8 10*3/uL (ref 1.4–7.7)
Platelets: 277 10*3/uL (ref 150.0–400.0)
RBC: 4.66 Mil/uL (ref 4.22–5.81)
RDW: 12.5 % (ref 11.5–15.5)
WBC: 6 10*3/uL (ref 4.0–10.5)

## 2015-11-27 LAB — VITAMIN D 25 HYDROXY (VIT D DEFICIENCY, FRACTURES): VITD: 17.55 ng/mL — AB (ref 30.00–100.00)

## 2015-11-27 LAB — HEMOGLOBIN A1C: Hgb A1c MFr Bld: 5.7 % (ref 4.6–6.5)

## 2015-11-27 LAB — TSH: TSH: 0.63 u[IU]/mL (ref 0.35–4.50)

## 2015-11-27 NOTE — Assessment & Plan Note (Signed)
Ulcerative colitis: Reports he saw GI few months ago, asx, was recommended a follow-up in about a year ED: poor response to Viagra, sx relief when he is on vacation, more relaxed;  stress-related?Marland Kitchen Counseled Fatigue: We'll check a testosterone level, vitamin Y-78, D, folic acid  He snores, will consider a sleep study in the future. Recommend exercise and weight loss RTC 3 months

## 2015-11-27 NOTE — Progress Notes (Signed)
Subjective:    Patient ID: Travis Greene, male    DOB: [birth year], 50 y.o.   MRN: [id number]  DOS:  11/27/2015 Type of visit - description : CPX Interval history: Patient reports he is eating healthy but has not been exercising regularly. ED: Continue with difficulty with erections, libido slightly decreased, he thinks is related to his schedule, usually when he is on vacation he feels better and is more sexually active. Occasionally feels fatigued, admits to some snoring but denies feeling sleepy throughout the day. No depression or anxiety.    Review of Systems Constitutional: No fever. No chills. No unexplained wt changes. No unusual sweats  HEENT: No dental problems, no ear discharge, no facial swelling, no voice changes. No eye discharge, no eye  redness , no  intolerance to light   Respiratory: No wheezing , no  difficulty breathing. No cough , no mucus production  Cardiovascular: No CP, no leg swelling , no  Palpitations  GI: no nausea, no vomiting, no diarrhea , no  abdominal pain.  No blood in the stools. No dysphagia, no odynophagia    Endocrine: No polyphagia, no polyuria , no polydipsia  GU: No dysuria, gross hematuria, difficulty urinating. No urinary urgency, no frequency.  Musculoskeletal: No joint swellings or unusual aches or pains  Skin: No change in the color of the skin, palor. Eczema well-controlled Allergic, immunologic: No environmental allergies , no  food allergies  Neurological: No dizziness no  syncope. No headaches. No diplopia, no slurred, no slurred speech, no motor deficits, no facial  Numbness  Hematological: No enlarged lymph nodes, no easy bruising , no unusual bleedings  Psychiatry: No suicidal ideas, no hallucinations, no beavior problems, no confusion.  No unusual/severe anxiety, no depression   Past Medical History  Diagnosis Date  . ED (erectile dysfunction) 11/09  . Ulcerative colitis Dx 2005    [facility], last Cscope 2010 Dr  Oletta Lamas, was Rx lialda  . Chest pain     negative stress test 11/2009  . Hyperthyroidism 12/30/2011    Past Surgical History  Procedure Laterality Date  . Appendectomy    . Vasectomy      Social History   Social History  . Marital Status: Married    Spouse Name: N/A  . Number of Children: 5  . Years of Education: N/A   Occupational History  .  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 Smoking status: Former Smoker -- 1.00 packs/day for 2 years    Types: Cigarettes    Quit date: 11/06/1997  . Smokeless tobacco: Never Used  . Alcohol Use: Yes     Comment: socially  . Drug Use: No     Comment: in the past   . Sexual Activity: Not on file   Other Topics Concern  . Not on file   Social History Narrative   [name], from [place] w/ wife and 3 of his 5 children     Family History  Problem Relation Age of Onset  . Diabetes Mother   . Hypertension Mother     ?  . Coronary artery disease Neg Hx   . Prostate cancer Neg Hx   . Colon cancer Neg Hx        Medication List       This list is accurate as of: 11/27/15  1:44 PM.  Always use your most recent med list.  mesalamine 1.2 g EC tablet  Commonly known as:  LIALDA  Take 1,200 mg by mouth 4 (four) times daily.           Objective:   Physical Exam BP 114/72 mmHg  Pulse 80  Temp(Src) 98.1 F (36.7 C) (Oral)  Ht 5' 8"  (1.727 m)  Wt 231 lb 9.6 oz (105.053 kg)  BMI 35.22 kg/m2  SpO2 97%  General:   Well developed, well nourished . NAD.  Neck: No  thyromegaly , normal carotid pulse HEENT:  Normocephalic . Face symmetric, atraumatic Lungs:  CTA B Normal respiratory effort, no intercostal retractions, no accessory muscle use. Heart: RRR,  no murmur.  No pretibial edema bilaterally  Abdomen:  Not distended, soft, non-tender. No rebound or rigidity.   Skin: Exposed areas without rash. Not pale. Not jaundice Neurologic:  alert & oriented X3.  Speech normal, gait  appropriate for age and unassisted Strength symmetric and appropriate for age.  Psych: Cognition and judgment appear intact.  Cooperative with normal attention span and concentration.  Behavior appropriate. No anxious or depressed appearing.    Assessment & Plan:   Assessment Ulcerative colitis, DX 2005, Dr. Oletta Lamas, [place]  2010, on [place] H/o ED Eczema - feet Chest pain  (-)  stress test 2011 H/o hyperthyroidism  Plan: Ulcerative colitis: Reports he saw GI few months ago, asx, was recommended a follow-up in about a year ED: poor response to Viagra, sx relief when he is on vacation, more relaxed;  stress-related?Marland Kitchen Counseled Fatigue: We'll check a testosterone level, vitamin L-57, D, folic acid  He snores, will consider a sleep study in the future. Recommend exercise and weight loss RTC 3 months

## 2015-11-27 NOTE — Progress Notes (Signed)
Pre visit review using our clinic review tool, if applicable. No additional management support is needed unless otherwise documented below in the visit note. 

## 2015-11-27 NOTE — Assessment & Plan Note (Signed)
Td 08 01-2014: DRE PSA WNL Cscope 2010, next per GI Diet and exercise discussed Labs: CMP, FLP, CBC, A1c, TSH, vitamin D, N40, folic acid, testosterone.

## 2015-11-27 NOTE — Patient Instructions (Signed)
GO TO THE LAB :      Get the blood work     GO TO THE FRONT DESK Schedule your next appointment for a follow-up in 3 months, no fasting

## 2015-11-28 ENCOUNTER — Telehealth: Payer: Self-pay | Admitting: Internal Medicine

## 2015-11-28 NOTE — Telephone Encounter (Signed)
Relation to AP:TCKF Call back [facility]: [facility] [address] - [place], [place] - [phone] (Phone) [phone] (Fax)         Reason for call:  Patient last seen PCP 11/27/15 and mesalamine (LIALDA) 1.2 G EC tablet was suppose to be prescribed please advise.

## 2015-12-01 ENCOUNTER — Encounter: Payer: Self-pay | Admitting: *Deleted

## 2015-12-01 MED ORDER — VITAMIN D (ERGOCALCIFEROL) 1.25 MG (50000 UNIT) PO CAPS: 50000.0000 [IU] | ORAL_CAPSULE | ORAL | Status: DC

## 2015-12-01 NOTE — Telephone Encounter (Signed)
Needs to call his GI

## 2015-12-01 NOTE — Telephone Encounter (Signed)
Dr. Larose Kells, please advise. You have not prescribed this medication for the pt in the past.

## 2015-12-01 NOTE — Addendum Note (Signed)
Addended by: Dorrene German on: 12/01/2015 08:50 AM   Modules accepted: Orders, SmartSet

## 2015-12-01 NOTE — Telephone Encounter (Signed)
Pt notified and verbalized understanding.

## 2015-12-02 NOTE — Telephone Encounter (Signed)
Travis Greene, could you please call pt and find out if he has a gastroenterologist, and if not, please ask him who prescribed mesalamine (LIALDA) to him in the past. Thank you.

## 2015-12-02 NOTE — Telephone Encounter (Signed)
Patient was confused in regards to why he would have to call he's GI, patient states PCP at last appt stated he would prescribed, patient states he doesn't know who he's GI doctor is and would like to speak with Kennyth Lose or Dr.Paz directly.

## 2015-12-03 NOTE — Telephone Encounter (Signed)
Spoke with pt 12-02-15 and there was a misunderstanding with rx, pt not needing LIALDA, pt just wanted to know if his rx for Vitamin D was sent to pharmacy. Pt was informed rx was mailed to home address attached with his lab results. Pt understood and is going to be aware of mail.

## 2016-02-18 ENCOUNTER — Telehealth: Payer: Self-pay | Admitting: Internal Medicine

## 2016-02-18 NOTE — Telephone Encounter (Signed)
Relation to RH:ZJGJ Call back [facility], [place] AT [address] [phone] (Phone) [phone] (Fax)        Reason for call:  Patient requesting a refill Vitamin D, Ergocalciferol, (DRISDOL) 50000 units CAPS capsule

## 2016-02-18 NOTE — Telephone Encounter (Signed)
Pt was informed the below and pt understood.

## 2016-02-18 NOTE — Telephone Encounter (Signed)
Travis Greene, please inform Pt that Ergocalciferol is only to be taken short term. If he has completed the 12 week prescription he can continue OTC Vit D at least 1000 units daily and we will recheck his Vit D at his next OV. Thank you.

## 2016-02-27 ENCOUNTER — Ambulatory Visit (INDEPENDENT_AMBULATORY_CARE_PROVIDER_SITE_OTHER): Payer: BLUE CROSS/BLUE SHIELD | Admitting: Internal Medicine

## 2016-02-27 ENCOUNTER — Encounter: Payer: Self-pay | Admitting: Internal Medicine

## 2016-02-27 VITALS — BP 118/74 | HR 58 | Temp 98.2°F | Resp 12 | Ht 68.0 in | Wt 236.4 lb

## 2016-02-27 DIAGNOSIS — R5383 Other fatigue: Secondary | ICD-10-CM

## 2016-02-27 DIAGNOSIS — E559 Vitamin D deficiency, unspecified: Secondary | ICD-10-CM | POA: Diagnosis not present

## 2016-02-27 LAB — VITAMIN D 25 HYDROXY (VIT D DEFICIENCY, FRACTURES): VITD: 26.94 ng/mL — ABNORMAL LOW (ref 30.00–100.00)

## 2016-02-27 NOTE — Progress Notes (Signed)
Subjective:    Patient ID: Travis Greene, male    DOB: [birth year], 50 y.o.   MRN: [id number]  DOS:  02/27/2016 Type of visit - description : Follow-up Interval history: Complain of fatigue the last time he was here, labs indicated at low vitamin D, testosterone levels were not checked. Was prescribed ergocalciferol, unfortunately he took 1 or 2 tablets daily for few days. Reports that he felt great, fatigue decrease significantly and also the erectile dysfunction diminished. At this point is again feeling somewhat fatigued.   Review of Systems Denies chest pain or difficulty breathing No nausea, vomiting, diarrhea No anxiety or depression  Past Medical History:  Diagnosis Date  . Chest pain    negative stress test 11/2009  . ED (erectile dysfunction) 11/09  . Hyperthyroidism 12/30/2011  . Ulcerative colitis Dx 2005   [facility], last Cscope 2010 Dr Oletta Lamas, was Rx lialda    Past Surgical History:  Procedure Laterality Date  . APPENDECTOMY    . VASECTOMY      Social History   Social History  . Marital status: Married    Spouse name: N/A  . Number of children: 5  . Years of education: N/A   Occupational History  .  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 Smoking status: Former Smoker    Packs/day: 1.00    Years: 2.00    Types: Cigarettes    Quit date: 11/06/1997  . Smokeless tobacco: Never Used  . Alcohol use Yes     Comment: socially  . Drug use: No  . Sexual activity: Not on file   Other Topics Concern  . Not on file   Social History Narrative   [name], from [place] w/ wife and 3 of his 5 children        Medication List       Accurate as of 02/27/16 11:59 PM. Always use your most recent med list.          mesalamine 1.2 g EC tablet Commonly known as:  LIALDA Take 1,200 mg by mouth 4 (four) times daily.   Vitamin D (Ergocalciferol) 50000 units Caps capsule Commonly known as:  DRISDOL Take 1 capsule (50,000 Units total)  by mouth every 7 (seven) days.          Objective:   Physical Exam BP 118/74 (BP Location: Left Arm, Patient Position: Sitting, Cuff Size: Normal)   Pulse (!) 58   Temp 98.2 F (36.8 C) (Oral)   Resp 12   Ht 5' 8"  (1.727 m)   Wt 236 lb 6 oz (107.2 kg)   SpO2 99%   BMI 35.94 kg/m  General:   Well developed, well nourished . NAD.  HEENT:  Normocephalic . Face symmetric, atraumatic Lungs:  CTA B Normal respiratory effort, no intercostal retractions, no accessory muscle use. Heart: RRR,  no murmur.  No pretibial edema bilaterally  Skin: Not pale. Not jaundice Neurologic:  alert & oriented X3.  Speech normal, gait appropriate for age and unassisted Psych--  Cognition and judgment appear intact.  Cooperative with normal attention span and concentration.  Behavior appropriate. No anxious or depressed appearing.      Assessment & Plan:   Assessment Ulcerative colitis, DX 2005, Dr. Oletta Lamas, [place]  2010, on [place] H/o ED Eczema - feet Vit d def dx 11-2015 Chest pain  (-)  stress test 2011 H/o hyperthyroidism Illiterate  Plan: Fatigue: See previous entry, testosterone was  not done, vitamin D was low. He got better with vitamin D supplements although he did not take it correctly (see HPI). Will recheck vitamin D and a T level. Start daily OTC vitamin D and consider ergocalciferol. Patient is illiterate , this was discussed carefully in [facility], instruction written as his wife is able to read. RTC  6 months

## 2016-02-27 NOTE — Patient Instructions (Signed)
GO TO THE LAB : Get the blood work     GO TO THE FRONT DESK Schedule your next appointment for a  Check up in 6 months   COMPRE   PASTILLAS  DE VITAMINA D  DE 1000 UNIDADES, TOME UN AL DIA POR LOS SIGUIENTES MESES  SI SU PRUEBA DE SANGRE LO INDICA, LE VOY A RECETAR TAMBIEN ERGOCALCIFEROL DE 50,000 UNIDADES, ESTA ES PARA TOMAR SOLO UNA A LA [place] POR 3 MESES    REGRESE EN 6 MESES

## 2016-02-27 NOTE — Progress Notes (Signed)
Pre visit review using our clinic review tool, if applicable. No additional management support is needed unless otherwise documented below in the visit note. 

## 2016-02-29 NOTE — Assessment & Plan Note (Signed)
Fatigue: See previous entry, testosterone was not done, vitamin D was low. He got better with vitamin D supplements although he did not take it correctly (see HPI). Will recheck vitamin D and a T level. Start daily OTC vitamin D and consider ergocalciferol. Patient is illiterate , this was discussed carefully in [place], instruction written as his wife is able to read. RTC  6 months

## 2016-03-01 LAB — TESTOSTERONE TOTAL,FREE,BIO, MALES
ALBUMIN: 4.4 g/dL (ref 3.6–5.1)
SEX HORMONE BINDING: 25 nmol/L (ref 10–50)
TESTOSTERONE BIOAVAILABLE: 73 ng/dL — AB (ref 130.5–681.7)
TESTOSTERONE FREE: 36.3 pg/mL — AB (ref 47.0–244.0)
TESTOSTERONE: 234 ng/dL — AB (ref 250–827)

## 2016-03-02 ENCOUNTER — Telehealth: Payer: Self-pay

## 2016-03-02 MED ORDER — VITAMIN D (ERGOCALCIFEROL) 1.25 MG (50000 UNIT) PO CAPS: 50000.0000 [IU] | ORAL_CAPSULE | ORAL | 0 refills | Status: DC

## 2016-03-02 NOTE — Telephone Encounter (Signed)
send ergocalciferol 50,000 u po q week #12, no RF  Received: Today  [place], MD  Damita Dunnings, CMA   Rx sent.

## 2016-03-31 ENCOUNTER — Encounter: Payer: Self-pay | Admitting: Internal Medicine

## 2016-03-31 ENCOUNTER — Ambulatory Visit (INDEPENDENT_AMBULATORY_CARE_PROVIDER_SITE_OTHER): Payer: BLUE CROSS/BLUE SHIELD | Admitting: Internal Medicine

## 2016-03-31 VITALS — BP 124/62 | HR 62 | Temp 98.2°F | Resp 14 | Ht 68.0 in | Wt 235.5 lb

## 2016-03-31 DIAGNOSIS — R5383 Other fatigue: Secondary | ICD-10-CM | POA: Diagnosis not present

## 2016-03-31 DIAGNOSIS — H60399 Other infective otitis externa, unspecified ear: Secondary | ICD-10-CM | POA: Diagnosis not present

## 2016-03-31 MED ORDER — NEOMYCIN-POLYMYXIN-HC 3.5-10000-1 OT SUSP: 3.0000 [drp] | Freq: Three times a day (TID) | OTIC | 0 refills | Status: DC

## 2016-03-31 NOTE — Progress Notes (Signed)
Subjective:    Patient ID: Travis Greene, male    DOB: 10-19-1965, 50 y.o.   MRN: [id number]  DOS:  03/31/2016 Type of visit - description : Acute visit Interval history: Several days history of left ear discomfort, some pain when he uses a Q-TIP. Has seen some yellow discharge/wax  Also reports he had a fall 2 days ago, hurt his right arm, complaining of pain mostly at the distal upper extremity. No LOC, no headache or neck pain.  Review of Systems Denies fever, chills. No runny nose or sore throat No headache  Past Medical History:  Diagnosis Date  . Chest pain    negative stress test 11/2009  . ED (erectile dysfunction) 11/09  . Hyperthyroidism 12/30/2011  . Ulcerative colitis Dx 2005   [facility], last Cscope 2010 Dr Oletta Lamas, was Rx lialda    Past Surgical History:  Procedure Laterality Date  . APPENDECTOMY    . VASECTOMY      Social History   Social History  . Marital status: Married    Spouse name: N/A  . Number of children: 5  . Years of education: N/A   Occupational History  .  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 Smoking status: Former Smoker    Packs/day: 1.00    Years: 2.00    Types: Cigarettes    Quit date: 11/06/1997  . Smokeless tobacco: Never Used  . Alcohol use Yes     Comment: socially  . Drug use: No  . Sexual activity: Not on file   Other Topics Concern  . Not on file   Social History Narrative   [name], from [place] w/ wife and 3 of his 5 children        Medication List       Accurate as of 03/31/16 11:59 PM. Always use your most recent med list.          mesalamine 1.2 g EC tablet Commonly known as:  LIALDA Take 1,200 mg by mouth 4 (four) times daily.   neomycin-polymyxin-hydrocortisone 3.5-10000-1 otic suspension Commonly known as:  CORTISPORIN Place 3 drops into the left ear 3 (three) times daily. As directed   Vitamin D (Ergocalciferol) 50000 units Caps capsule Commonly known as:   DRISDOL Take 1 capsule (50,000 Units total) by mouth every 7 (seven) days.          Objective:   Physical Exam BP 124/62 (BP Location: Left Arm, Patient Position: Sitting, Cuff Size: Normal)   Pulse 62   Temp 98.2 F (36.8 C) (Oral)   Resp 14   Ht 5' 8"  (1.727 m)   Wt 235 lb 8 oz (106.8 kg)   SpO2 98%   BMI 35.81 kg/m  General:   Well developed, well nourished . NAD.  HEENT:  Normocephalic . Face symmetric, atraumatic. Nose not congested, sinuses no TTP, mastoid areas no TTP. Right TM normal. Left TM: Obscured by a small amount of yellow discharge. Canal without swelling or redness. Left trago  sign mildly positive. Lungs:  CTA B Normal respiratory effort, no intercostal retractions, no accessory muscle use. Heart: RRR,  no murmur.  No pretibial edema bilaterally  MSK: Right arm is some ecchymoses. Elbow, wrist and hands: normal ROM, no deformities or effusions. Neurologic:  alert & oriented X3.  Speech normal, gait appropriate for age and unassisted Psych--  Cognition and judgment appear intact.  Cooperative with normal attention span and concentration.  Behavior appropriate. No anxious or depressed appearing.      Assessment & Plan:   Assessment Ulcerative colitis, DX 2005, Dr. Oletta Lamas, [place]  2010, on [place] H/o ED Eczema - feet Vit d def dx 11-2015 Chest pain  (-)  stress test 2011 H/o hyperthyroidism Illiterate   PLAN Otitis externa: Prescribed eardrops,   instructions discussed in Spanish. R Arm injury: Had a fall 2 days ago, no major injuries except for his arm.  Recommend Tylenol/ibuprofen and call if not back to normal soon. Fatigue: Recent labs showed low vitamin D (treated) and a slightly low T, will recheck in few months RTC 08-2016 as a schedule

## 2016-03-31 NOTE — Patient Instructions (Signed)
USE LAS GOTAS 3 VECES AL DIA POR 5 DIAS  LLAME SI NO MEJORA  TYLENOL O IBUPROFENO "OVER THE COUNTER" SI NECESITA ALGO PARA EL DOLOR

## 2016-03-31 NOTE — Progress Notes (Signed)
Pre visit review using our clinic review tool, if applicable. No additional management support is needed unless otherwise documented below in the visit note. 

## 2016-04-01 NOTE — Assessment & Plan Note (Signed)
Otitis externa: Prescribed eardrops,   instructions discussed in Spanish. R Arm injury: Had a fall 2 days ago, no major injuries except for his arm.  Recommend Tylenol/ibuprofen and call if not back to normal soon. Fatigue: Recent labs showed low vitamin D (treated) and a slightly low T, will recheck in few months RTC 08-2016 as a schedule

## 2016-04-22 DIAGNOSIS — H1045 Other chronic allergic conjunctivitis: Secondary | ICD-10-CM | POA: Diagnosis not present

## 2016-06-04 DIAGNOSIS — K5130 Ulcerative (chronic) rectosigmoiditis without complications: Secondary | ICD-10-CM | POA: Diagnosis not present

## 2016-07-20 ENCOUNTER — Ambulatory Visit: Payer: BLUE CROSS/BLUE SHIELD | Admitting: Internal Medicine

## 2016-08-31 ENCOUNTER — Encounter: Payer: Self-pay | Admitting: Internal Medicine

## 2016-08-31 ENCOUNTER — Ambulatory Visit (INDEPENDENT_AMBULATORY_CARE_PROVIDER_SITE_OTHER): Payer: BLUE CROSS/BLUE SHIELD | Admitting: Internal Medicine

## 2016-08-31 VITALS — BP 128/76 | HR 64 | Temp 98.3°F | Resp 14 | Ht 68.0 in | Wt 233.0 lb

## 2016-08-31 DIAGNOSIS — R7989 Other specified abnormal findings of blood chemistry: Secondary | ICD-10-CM

## 2016-08-31 DIAGNOSIS — E559 Vitamin D deficiency, unspecified: Secondary | ICD-10-CM | POA: Diagnosis not present

## 2016-08-31 DIAGNOSIS — Z1159 Encounter for screening for other viral diseases: Secondary | ICD-10-CM

## 2016-08-31 DIAGNOSIS — F528 Other sexual dysfunction not due to a substance or known physiological condition: Secondary | ICD-10-CM | POA: Diagnosis not present

## 2016-08-31 DIAGNOSIS — E349 Endocrine disorder, unspecified: Secondary | ICD-10-CM

## 2016-08-31 DIAGNOSIS — Z114 Encounter for screening for human immunodeficiency virus [HIV]: Secondary | ICD-10-CM

## 2016-08-31 LAB — HIV ANTIBODY (ROUTINE TESTING W REFLEX): HIV: NONREACTIVE

## 2016-08-31 LAB — HEPATITIS C ANTIBODY: HCV AB: NEGATIVE

## 2016-08-31 MED ORDER — SILDENAFIL CITRATE 20 MG PO TABS: 60.0000 mg | ORAL_TABLET | Freq: Every evening | ORAL | 3 refills | Status: DC | PRN

## 2016-08-31 NOTE — Progress Notes (Signed)
Pre visit review using our clinic review tool, if applicable. No additional management support is needed unless otherwise documented below in the visit note. 

## 2016-08-31 NOTE — Patient Instructions (Signed)
GO TO THE LAB : Get the blood work     GO TO THE FRONT DESK Schedule your next appointment for a  physical exam by April 2018, fasting  Sildenafil : 3 o 4 tabletas 1 hora antes de [place] sexual   siga con OTC Vitamina D 2000 unidades al dia

## 2016-08-31 NOTE — Progress Notes (Signed)
Subjective:    Patient ID: Travis Greene, male    DOB: [birth year], 51 y.o.   MRN: [id number]  DOS:  08/31/2016 Type of visit - description : rov Interval history: His main concern today is erectile dysfunction, erections don't last long. Libido normal. Vitamin D deficiency: Status post ergocalciferol, taking OTC supplements.   Review of Systems Denies chest pain, difficulty breathing No claudication stress okay, mild and at baseline  Past Medical History:  Diagnosis Date  . Chest pain    negative stress test 11/2009  . ED (erectile dysfunction) 11/09  . Hyperthyroidism 12/30/2011  . Ulcerative colitis Dx 2005   [facility], last Cscope 2010 Dr Oletta Lamas, was Rx lialda    Past Surgical History:  Procedure Laterality Date  . APPENDECTOMY    . VASECTOMY      Social History   Social History  . Marital status: Married    Spouse name: N/A  . Number of children: 5  . Years of education: N/A   Occupational History  .  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 Smoking status: Former Smoker    Packs/day: 1.00    Years: 2.00    Types: Cigarettes    Quit date: 11/06/1997  . Smokeless tobacco: Never Used  . Alcohol use Yes     Comment: socially  . Drug use: No  . Sexual activity: Not on file   Other Topics Concern  . Not on file   Social History Narrative   [name], from [place] w/ wife and 3 of his 5 children      Allergies as of 08/31/2016   No Known Allergies     Medication List       Accurate as of 08/31/16 11:59 PM. Always use your most recent med list.          mesalamine 1.2 g EC tablet Commonly known as:  LIALDA Take 1,200 mg by mouth 4 (four) times daily.   sildenafil 20 MG tablet Commonly known as:  REVATIO Take 3-4 tablets (60-80 mg total) by mouth at bedtime as needed.          Objective:   Physical Exam BP 128/76 (BP Location: Left Arm, Patient Position: Sitting, Cuff Size: Normal)   Pulse 64   Temp 98.3 F (36.8  C) (Oral)   Resp 14   Ht 5' 8"  (1.727 m)   Wt 233 lb (105.7 kg)   SpO2 98%   BMI 35.43 kg/m  General:   Well developed, well nourished . NAD.  HEENT:  Normocephalic . Face symmetric, atraumatic Lungs:  CTA B Normal respiratory effort, no intercostal retractions, no accessory muscle use. Heart: RRR,  no murmur.  No pretibial edema bilaterally  Normal femoral pulses bilaterally GU: Normal scrotal contents  Skin: Not pale. Not jaundice Neurologic:  alert & oriented X3.  Speech normal, gait appropriate for age and unassisted Psych--  Cognition and judgment appear intact.  Cooperative with normal attention span and concentration.  Behavior appropriate. No anxious or depressed appearing.      Assessment & Plan:   Assessment Ulcerative colitis, DX 2005, Dr. Oletta Lamas, [place]  2010, on [place] H/o ED Eczema - feet Vit d def dx 11-2015 Chest pain  (-)  stress test 2011 H/o hyperthyroidism Illiterate   PLAN ED: Continue to be an issue, vascular exam (-), Viagra didn't work much in the past but he is willing to retry. Prescription provided Mildly decreased  testosterone: Recheck levels Vitamin D deficiency: S/P ergocalciferol, now on OTCs 2000 units, recheck labs. She did feel better while he was taking ergocalciferol Primary care. Will check HIV and hep C RTC CPX  10/2016

## 2016-09-01 NOTE — Assessment & Plan Note (Signed)
ED: Continue to be an issue, vascular exam (-), Viagra didn't work much in the past but he is willing to retry. Prescription provided Mildly decreased testosterone: Recheck levels Vitamin D deficiency: S/P ergocalciferol, now on OTCs 2000 units, recheck labs. She did feel better while he was taking ergocalciferol Primary care. Will check HIV and hep C RTC CPX  10/2016

## 2016-09-02 ENCOUNTER — Telehealth: Payer: Self-pay | Admitting: Internal Medicine

## 2016-09-02 MED ORDER — SILDENAFIL CITRATE 20 MG PO TABS: 60.0000 mg | ORAL_TABLET | Freq: Every evening | ORAL | 3 refills | Status: DC | PRN

## 2016-09-02 NOTE — Telephone Encounter (Signed)
Caller name: Hance Relation to pt: self Call back number: [phone] Pharmacy:[facility] [place], [place] - [address] AT [place]  Reason for call: Pt states was given rx for sildenafil (REVATIO) 20 MG tablet 3 to 4 times a day before bed time, pt went to pharmacy and was informed that insurance will not cover his rx since it is to many pills given to pt and would have to pay 700.00 the pt for the amount prescribe to him, pt states can not pay for that and verified that insurance will cover only for this prescription 4 pills a month. Pt would like less pills prescribe in the month so that insurance can cover his prescription. Pt

## 2016-09-02 NOTE — Telephone Encounter (Signed)
Rx sent for 4 tablets.

## 2016-09-02 NOTE — Telephone Encounter (Signed)
Please advise 

## 2016-09-03 LAB — VITAMIN D 1,25 DIHYDROXY
Vitamin D 1, 25 (OH)2 Total: 38 pg/mL (ref 18–72)
Vitamin D2 1, 25 (OH)2: <8 pg/mL
Vitamin D3 1, 25 (OH)2: 38 pg/mL

## 2016-09-08 LAB — TESTOS,TOTAL,FREE AND SHBG (FEMALE)
Sex Hormone Binding Glob.: 24 nmol/L (ref 10–50)
Testosterone, Free: 58.8 pg/mL (ref 35.0–155.0)
Testosterone,Total,LC/MS/MS: 338 ng/dL (ref 250–1100)

## 2016-09-17 ENCOUNTER — Ambulatory Visit (INDEPENDENT_AMBULATORY_CARE_PROVIDER_SITE_OTHER): Payer: BLUE CROSS/BLUE SHIELD | Admitting: Internal Medicine

## 2016-09-17 ENCOUNTER — Encounter: Payer: Self-pay | Admitting: Internal Medicine

## 2016-09-17 VITALS — BP 126/78 | HR 65 | Temp 98.1°F | Resp 14 | Ht 68.0 in | Wt 237.2 lb

## 2016-09-17 DIAGNOSIS — H6690 Otitis media, unspecified, unspecified ear: Secondary | ICD-10-CM

## 2016-09-17 DIAGNOSIS — R7989 Other specified abnormal findings of blood chemistry: Secondary | ICD-10-CM

## 2016-09-17 DIAGNOSIS — E349 Endocrine disorder, unspecified: Secondary | ICD-10-CM

## 2016-09-17 DIAGNOSIS — F528 Other sexual dysfunction not due to a substance or known physiological condition: Secondary | ICD-10-CM

## 2016-09-17 MED ORDER — NEOMYCIN-POLYMYXIN-HC 3.5-10000-1 OT SUSP: 3.0000 [drp] | Freq: Four times a day (QID) | OTIC | 0 refills | Status: DC

## 2016-09-17 MED ORDER — AMOXICILLIN 875 MG PO TABS: 875.0000 mg | ORAL_TABLET | Freq: Two times a day (BID) | ORAL | 0 refills | Status: DC

## 2016-09-17 NOTE — Patient Instructions (Signed)
TOME EL ANTIBIOTICO 2 VECES AL DIA X 1 SEMANA  PONGASE LAS 3 GOTAS, CUATRO VECES AL DIA X 5 DIAS  TYLENOL SI TIENE DOLOR  LLAME SI NO ESTA CURADO EN 2 SEMANAS  LLAME SI SE PONE PEOR   NO USE Q-TIPS

## 2016-09-17 NOTE — Progress Notes (Signed)
Pre visit review using our clinic review tool, if applicable. No additional management support is needed unless otherwise documented below in the visit note. 

## 2016-09-17 NOTE — Progress Notes (Signed)
Subjective:    Patient ID: Travis Greene, male    DOB: 1966/07/10, 51 y.o.   MRN: [id number]  DOS:  09/17/2016 Type of visit - description : Acute visit Interval history: 3 days ago developed a discomfort at the left ear and some tinnitus. Discomfort is not better but the tinnitus persist. Some ear itching, he tried to "clean" the ear with a Q-tip, saw small amount of blood but no d/c.   Review of Systems Denies fever, chills. No sore throat, runny nose. No ear discharge per se   Past Medical History:  Diagnosis Date  . Chest pain    negative stress test 11/2009  . ED (erectile dysfunction) 11/09  . Hyperthyroidism 12/30/2011  . Ulcerative colitis Dx 2005   [facility], last Cscope 2010 Dr Oletta Lamas, was Rx lialda    Past Surgical History:  Procedure Laterality Date  . APPENDECTOMY    . VASECTOMY      Social History   Social History  . Marital status: Married    Spouse name: N/A  . Number of children: 5  . Years of education: N/A   Occupational History  .  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 Smoking status: Former Smoker    Packs/day: 1.00    Years: 2.00    Types: Cigarettes    Quit date: 11/06/1997  . Smokeless tobacco: Never Used  . Alcohol use Yes     Comment: socially  . Drug use: No  . Sexual activity: Not on file   Other Topics Concern  . Not on file   Social History Narrative   [name], from [place] w/ wife and 3 of his 5 children      Allergies as of 09/17/2016   No Known Allergies     Medication List       Accurate as of 09/17/16 11:59 PM. Always use your most recent med list.          amoxicillin 875 MG tablet Commonly known as:  AMOXIL Take 1 tablet (875 mg total) by mouth 2 (two) times daily.   mesalamine 1.2 g EC tablet Commonly known as:  LIALDA Take 1,200 mg by mouth 4 (four) times daily.   neomycin-polymyxin-hydrocortisone 3.5-10000-1 otic suspension Commonly known as:  CORTISPORIN Place 3 drops  into the left ear 4 (four) times daily. As directed   sildenafil 20 MG tablet Commonly known as:  REVATIO Take 3-4 tablets (60-80 mg total) by mouth at bedtime as needed.          Objective:   Physical Exam BP 126/78 (BP Location: Left Arm, Patient Position: Sitting, Cuff Size: Normal)   Pulse 65   Temp 98.1 F (36.7 C) (Oral)   Resp 14   Ht 5' 8"  (1.727 m)   Wt 237 lb 4 oz (107.6 kg)   SpO2 97%   BMI 36.07 kg/m  General:   Well developed, well nourished . NAD.  HEENT:  Normocephalic . Face symmetric, atraumatic Right TM normal Left tm: White in color, suspect discharge behind the membrane. Canal is normal, no trago  sign. No peri-auricular swelling.  Skin: Not pale. Not jaundice Neurologic:  alert & oriented X3.  Speech normal, gait appropriate for age and unassisted Psych--  Cognition and judgment appear intact.  Cooperative with normal attention span and concentration.  Behavior appropriate. No anxious or depressed appearing.      Assessment & Plan:   Assessment Ulcerative  colitis, DX 2005, Dr. Oletta Lamas, [place]  2010, on [place] H/o ED Eczema - feet Vit d def dx 11-2015 Chest pain  (-)  stress test 2011 H/o hyperthyroidism Illiterate   PLAN L OMA: Rx amoxicillin, antibiotic drops, call if pain and tinnitus are not completely resolved. All instructions discussed in Spanish. ED: Generic sildenafil worked great: RF as needed H/o  Mild decreasing testosterone: Rechecked  labs were normal.

## 2016-09-18 NOTE — Assessment & Plan Note (Signed)
L OMA: Rx amoxicillin, antibiotic drops, call if pain and tinnitus are not completely resolved. All instructions discussed in Spanish. ED: Generic sildenafil worked great: RF as needed H/o  Mild decreasing testosterone: Rechecked  labs were normal.

## 2016-09-28 ENCOUNTER — Ambulatory Visit (INDEPENDENT_AMBULATORY_CARE_PROVIDER_SITE_OTHER): Payer: BLUE CROSS/BLUE SHIELD | Admitting: Internal Medicine

## 2016-09-28 ENCOUNTER — Ambulatory Visit: Payer: BLUE CROSS/BLUE SHIELD | Admitting: Internal Medicine

## 2016-09-28 ENCOUNTER — Encounter: Payer: Self-pay | Admitting: Internal Medicine

## 2016-09-28 VITALS — BP 124/68 | HR 63 | Temp 98.3°F | Resp 14 | Ht 68.0 in | Wt 233.4 lb

## 2016-09-28 DIAGNOSIS — H6690 Otitis media, unspecified, unspecified ear: Secondary | ICD-10-CM | POA: Diagnosis not present

## 2016-09-28 MED ORDER — PREDNISONE 10 MG PO TABS: ORAL_TABLET | ORAL | 0 refills | Status: DC

## 2016-09-28 MED ORDER — AMOXICILLIN 875 MG PO TABS: 875.0000 mg | ORAL_TABLET | Freq: Two times a day (BID) | ORAL | 0 refills | Status: DC

## 2016-09-28 NOTE — Progress Notes (Signed)
Subjective:    Patient ID: Travis Greene, male    DOB: [birth year], 51 y.o.   MRN: [id number]  DOS:  09/28/2016 Type of visit - description : acute Interval history: Since the last office visit, he took the medication as prescribed. Is here today because the left ear still feel full and continue with  tinnitus  Review of Systems  No fever chills No hearing loss No discharge from the ear No periarticular swelling.  Past Medical History:  Diagnosis Date  . Chest pain    negative stress test 11/2009  . ED (erectile dysfunction) 11/09  . Hyperthyroidism 12/30/2011  . Ulcerative colitis Dx 2005   [facility], last Cscope 2010 Dr Oletta Lamas, was Rx lialda    Past Surgical History:  Procedure Laterality Date  . APPENDECTOMY    . VASECTOMY      Social History   Social History  . Marital status: Married    Spouse name: N/A  . Number of children: 5  . Years of education: N/A   Occupational History  .  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 Smoking status: Former Smoker    Packs/day: 1.00    Years: 2.00    Types: Cigarettes    Quit date: 11/06/1997  . Smokeless tobacco: Never Used  . Alcohol use Yes     Comment: socially  . Drug use: No  . Sexual activity: Not on file   Other Topics Concern  . Not on file   Social History Narrative   [name], from [place] w/ wife and 3 of his 5 children      Allergies as of 09/28/2016   No Known Allergies     Medication List       Accurate as of 09/28/16 11:59 PM. Always use your most recent med list.          amoxicillin 875 MG tablet Commonly known as:  AMOXIL Take 1 tablet (875 mg total) by mouth 2 (two) times daily.   mesalamine 1.2 g EC tablet Commonly known as:  LIALDA Take 1,200 mg by mouth 4 (four) times daily.   predniSONE 10 MG tablet Commonly known as:  DELTASONE 3 tabs x 2 days, 2 tabs x 2 days, 1 tab x 2 days   sildenafil 20 MG tablet Commonly known as:  REVATIO Take 3-4 tablets  (60-80 mg total) by mouth at bedtime as needed.          Objective:   Physical Exam BP 124/68 (BP Location: Left Arm, Patient Position: Sitting, Cuff Size: Normal)   Pulse 63   Temp 98.3 F (36.8 C) (Oral)   Resp 14   Ht 5' 8"  (1.727 m)   Wt 233 lb 6 oz (105.9 kg)   SpO2 98%   BMI 35.48 kg/m  General:   Well developed, well nourished . NAD.  HEENT:  Normocephalic . Face symmetric, atraumatic. No swelling at the periauricular area. R ear- wnl Left ear-- TM is now pink, no bulge. Canal without swelling or discharge. No TTP at the mastoid areas Skin: Not pale. Not jaundice Neurologic:  alert & oriented X3.  Speech normal, gait appropriate for age and unassisted Psych--  Cognition and judgment appear intact.  Cooperative with normal attention span and concentration.  Behavior appropriate. No anxious or depressed appearing.      Assessment & Plan:   Assessment Ulcerative colitis, DX 2005, Dr. Oletta Lamas, [place]  2010, on Lialda  H/o ED Eczema - feet Vit d def dx 11-2015 Chest pain  (-)  stress test 2011 H/o hyperthyroidism Illiterate   PLAN L OMA: Still symptomatic but no obvious complication, they TM actually looks better. Will add a few more days of amoxicillin, prednisone. He already finished eardrops.  Tocall in 2 weeks if not completely well for a ENT referral

## 2016-09-28 NOTE — Patient Instructions (Signed)
Tome amoxicilina unos [place] que se acabe  llame en 2 semanas si no esta bien

## 2016-09-28 NOTE — Progress Notes (Signed)
Pre visit review using our clinic review tool, if applicable. No additional management support is needed unless otherwise documented below in the visit note. 

## 2016-09-29 NOTE — Assessment & Plan Note (Signed)
L OMA: Still symptomatic but no obvious complication, they TM actually looks better. Will add a few more days of amoxicillin, prednisone. He already finished eardrops.  Tocall in 2 weeks if not completely well for a ENT referral

## 2016-11-26 ENCOUNTER — Other Ambulatory Visit: Payer: Self-pay | Admitting: Internal Medicine

## 2016-11-29 ENCOUNTER — Encounter: Payer: BLUE CROSS/BLUE SHIELD | Admitting: Internal Medicine

## 2017-05-09 ENCOUNTER — Ambulatory Visit: Payer: BLUE CROSS/BLUE SHIELD | Admitting: Internal Medicine

## 2017-05-09 ENCOUNTER — Telehealth: Payer: Self-pay | Admitting: Internal Medicine

## 2017-05-09 NOTE — Telephone Encounter (Signed)
Caller name:Teems,Gabriella Relation to QV:[id number]  Call back number:[phone]  Reason for call:  Daughter called cancelling patient 10:30am appointment, daughter states patient was being seen today for "ear popping" patient used ear drops and now symptoms have improved, charge or no charge

## 2017-05-09 NOTE — Telephone Encounter (Signed)
No, thx

## 2017-07-13 DIAGNOSIS — K5130 Ulcerative (chronic) rectosigmoiditis without complications: Secondary | ICD-10-CM | POA: Diagnosis not present

## 2017-08-24 DIAGNOSIS — Z1211 Encounter for screening for malignant neoplasm of colon: Secondary | ICD-10-CM | POA: Diagnosis not present

## 2017-08-24 DIAGNOSIS — K5289 Other specified noninfective gastroenteritis and colitis: Secondary | ICD-10-CM | POA: Diagnosis not present

## 2017-08-24 DIAGNOSIS — K5130 Ulcerative (chronic) rectosigmoiditis without complications: Secondary | ICD-10-CM | POA: Diagnosis not present

## 2017-08-24 DIAGNOSIS — K6389 Other specified diseases of intestine: Secondary | ICD-10-CM | POA: Diagnosis not present

## 2017-08-24 LAB — HM COLONOSCOPY

## 2017-08-30 DIAGNOSIS — K5289 Other specified noninfective gastroenteritis and colitis: Secondary | ICD-10-CM | POA: Diagnosis not present

## 2017-08-30 DIAGNOSIS — K6389 Other specified diseases of intestine: Secondary | ICD-10-CM | POA: Diagnosis not present

## 2017-09-05 ENCOUNTER — Ambulatory Visit: Payer: BLUE CROSS/BLUE SHIELD | Admitting: Medical

## 2017-09-05 ENCOUNTER — Encounter: Payer: Self-pay | Admitting: Medical

## 2017-09-05 VITALS — BP 115/79 | HR 71 | Temp 97.7°F | Resp 16 | Ht 68.0 in | Wt 231.2 lb

## 2017-09-05 DIAGNOSIS — J111 Influenza due to unidentified influenza virus with other respiratory manifestations: Secondary | ICD-10-CM | POA: Diagnosis not present

## 2017-09-05 DIAGNOSIS — J3489 Other specified disorders of nose and nasal sinuses: Secondary | ICD-10-CM | POA: Diagnosis not present

## 2017-09-05 DIAGNOSIS — R059 Cough, unspecified: Secondary | ICD-10-CM

## 2017-09-05 DIAGNOSIS — R05 Cough: Secondary | ICD-10-CM

## 2017-09-05 DIAGNOSIS — R52 Pain, unspecified: Secondary | ICD-10-CM | POA: Diagnosis not present

## 2017-09-05 LAB — POCT INFLUENZA A/B
INFLUENZA B, POC: NEGATIVE
Influenza A, POC: POSITIVE — AB

## 2017-09-05 MED ORDER — BENZONATATE 100 MG PO CAPS: 100.0000 mg | ORAL_CAPSULE | Freq: Three times a day (TID) | ORAL | 0 refills | Status: DC | PRN

## 2017-09-05 MED ORDER — FLUTICASONE PROPIONATE 50 MCG/ACT NA SUSP: 2.0000 | Freq: Every day | NASAL | 1 refills | Status: DC

## 2017-09-05 MED ORDER — DOXYCYCLINE HYCLATE 100 MG PO TABS: 100.0000 mg | ORAL_TABLET | Freq: Two times a day (BID) | ORAL | 0 refills | Status: DC

## 2017-09-05 MED ORDER — OSELTAMIVIR PHOSPHATE 75 MG PO CAPS: 75.0000 mg | ORAL_CAPSULE | Freq: Two times a day (BID) | ORAL | 0 refills | Status: DC

## 2017-09-05 NOTE — Progress Notes (Signed)
Subjective:    Patient ID: Travis Greene, male    DOB: [birth year], 52 y.o.   MRN: [id number]  HPI  Pt in for flu like symptoms. Pt has bodyaches that started on Saturday. Pt states 3 of children had flu.   Pt has some sinus pressure. Some nasal congestion.  Pt did not get flu vaccine this year.   No wheezing or sob.      Review of Systems  Constitutional: Positive for fatigue. Negative for chills and fever.  HENT: Positive for congestion, sinus pressure and sinus pain.   Respiratory: Positive for cough. Negative for chest tightness, shortness of breath and wheezing.        Light.  Cardiovascular: Negative for chest pain and palpitations.  Gastrointestinal: Negative for abdominal pain.  Musculoskeletal: Negative for back pain, joint swelling and myalgias.  Skin: Negative for rash.  Neurological: Negative for dizziness, weakness and headaches.  Hematological: Negative for adenopathy. Does not bruise/bleed easily.  Psychiatric/Behavioral: Negative for behavioral problems and confusion.    Past Medical History:  Diagnosis Date  . Chest pain    negative stress test 11/2009  . ED (erectile dysfunction) 11/09  . Hyperthyroidism 12/30/2011  . Ulcerative colitis Dx 2005   [facility], last Cscope 2010 Dr Oletta Lamas, was Rx lialda     Social History   Socioeconomic History  . Marital status: Married    Spouse name: Not on file  . Number of children: 5  . Years of education: Not on file  . Highest education level: Not on file  Social Needs  . Financial resource strain: Not on file  . Food insecurity - worry: Not on file  . Food insecurity - inability: Not on file  . Transportation needs - medical: Not on file  . Transportation needs - non-medical: Not on file  Occupational History  . Occupation:  Building control surveyor   Tobacco Use  . Smoking status: Former Smoker    Packs/day: 1.00    Years: 2.00    Pack years: 2.00    Types: Cigarettes    Last attempt to quit: 11/06/1997      Years since quitting: 19.8  . Smokeless tobacco: Never Used  Substance and Sexual Activity  . Alcohol use: Yes    Comment: socially  . Drug use: No  . Sexual activity: Not on file  Other Topics Concern  . Not on file  Social History Narrative   [name], from [place] w/ wife and 3 of his 5 children    Past Surgical History:  Procedure Laterality Date  . APPENDECTOMY    . VASECTOMY      Family History  Problem Relation Age of Onset  . Diabetes Mother   . Hypertension Mother        ?  . Coronary artery disease Neg Hx   . Prostate cancer Neg Hx   . Colon cancer Neg Hx     No Known Allergies  Current Outpatient Medications on File Prior to Visit  Medication Sig Dispense Refill  . mesalamine (LIALDA) 1.2 G EC tablet Take 1,200 mg by mouth 4 (four) times daily.    . predniSONE (DELTASONE) 10 MG tablet 3 tabs x 2 days, 2 tabs x 2 days, 1 tab x 2 days 12 tablet 0  . sildenafil (REVATIO) 20 MG tablet Take 3-4 tabs by mouth at bedtime as needed 30 tablet 0   No current facility-administered medications on file prior to visit.  BP 115/79   Pulse 71   Temp 97.7 F (36.5 C) (Oral)   Resp 16   Ht 5' 8"  (1.727 m)   Wt 231 lb 3.2 oz (104.9 kg)   SpO2 99%   BMI 35.15 kg/m       Objective:   Physical Exam   General  Mental Status - Alert. General Appearance - Well groomed. Not in acute distress.  Skin Rashes- No Rashes.  HEENT Head- Normal. Ear Auditory Canal - Left- Normal. Right - Normal.Tympanic Membrane- Left- Normal. Right- Normal. Eye Sclera/Conjunctiva- Left- Normal. Right- Normal. Nose & Sinuses Nasal Mucosa- Left-  Boggy and Congested. Right-  Boggy and  Congested.Bilateral maxillary and frontal sinus pressure. Mouth & Throat Lips: Upper Lip- Normal: no dryness, cracking, pallor, cyanosis, or vesicular eruption. Lower Lip-Normal: no dryness, cracking, pallor, cyanosis or vesicular eruption. Buccal Mucosa- Bilateral- No Aphthous  ulcers. Oropharynx- No Discharge or Erythema. Tonsils: Characteristics- Bilateral- No Erythema or Congestion. Size/Enlargement- Bilateral- No enlargement. Discharge- bilateral-None.  Neck Neck- Supple. No Masses.   Chest and Lung Exam Auscultation: Breath Sounds:-Clear even and unlabored.  Cardiovascular Auscultation:Rythm- Regular, rate and rhythm. Murmurs & Other Heart Sounds:Ausculatation of the heart reveal- No Murmurs.  Lymphatic Head & Neck General Head & Neck Lymphatics: Bilateral: Description- No Localized lymphadenopathy.      Assessment & Plan:  Your flu test was positive.  Please start Tamiflu today.  Can use Tylenol or ibuprofen for body aches or fever.  Try to rest and stay well-hydrated.  For cough, I am prescribing benzonatate.  For nasal congestion prescription of Flonase.  If your sinus pressure worsens despite the above will you get bronchitis/or even pneumonia type symptoms then please start the doxycycline antibiotic.(Rx advisement given with antibiotic use.  Benefit versus risk discussed.)  Follow-up in 7-10 days or as needed.  Mackie Pai, PA-C

## 2017-09-05 NOTE — Patient Instructions (Signed)
Your flu test was positive.  Please start Tamiflu today.  Can use Tylenol or ibuprofen for body aches or fever.  Try to rest and stay well-hydrated.  For cough, I am prescribing benzonatate.  For nasal congestion prescription of Flonase.  If your sinus pressure worsens despite the above will you get bronchitis/or even pneumonia type symptoms then please start the doxycycline antibiotic.(Rx advisement given with antibiotic use.  Benefit versus risk discussed.)  Follow-up in 7-10 days or as needed.

## 2017-09-07 ENCOUNTER — Encounter: Payer: Self-pay | Admitting: Medical

## 2017-09-16 ENCOUNTER — Encounter: Payer: Self-pay | Admitting: Internal Medicine

## 2017-09-20 ENCOUNTER — Ambulatory Visit
Admission: RE | Admit: 2017-09-20 | Discharge: 2017-09-20 | Disposition: A | Discharge status: Home / Self Care | Payer: BLUE CROSS/BLUE SHIELD | Source: Ambulatory Visit | Attending: Gastroenterology | Admitting: Gastroenterology

## 2017-09-20 ENCOUNTER — Other Ambulatory Visit: Payer: Self-pay | Admitting: Gastroenterology

## 2017-09-20 DIAGNOSIS — K51919 Ulcerative colitis, unspecified with unspecified complications: Secondary | ICD-10-CM

## 2017-09-20 DIAGNOSIS — K5190 Ulcerative colitis, unspecified, without complications: Secondary | ICD-10-CM | POA: Diagnosis not present

## 2017-10-21 ENCOUNTER — Encounter: Payer: Self-pay | Admitting: Internal Medicine

## 2017-10-21 ENCOUNTER — Ambulatory Visit (INDEPENDENT_AMBULATORY_CARE_PROVIDER_SITE_OTHER): Payer: BLUE CROSS/BLUE SHIELD | Admitting: Internal Medicine

## 2017-10-21 VITALS — BP 134/82 | HR 63 | Temp 97.7°F | Resp 14 | Ht 68.0 in | Wt 234.1 lb

## 2017-10-21 DIAGNOSIS — Z0000 Encounter for general adult medical examination without abnormal findings: Secondary | ICD-10-CM | POA: Diagnosis not present

## 2017-10-21 LAB — TSH: TSH: 0.65 u[IU]/mL (ref 0.35–4.50)

## 2017-10-21 LAB — LIPID PANEL
CHOL/HDL RATIO: 3
Cholesterol: 197 mg/dL (ref 0–200)
HDL: 59.2 mg/dL (ref >39.00)
LDL Cholesterol: 122 mg/dL — ABNORMAL HIGH (ref 0–99)
NonHDL: 138.09
TRIGLYCERIDES: 79 mg/dL (ref 0.0–149.0)
VLDL: 15.8 mg/dL (ref 0.0–40.0)

## 2017-10-21 LAB — CBC WITH DIFFERENTIAL/PLATELET
BASOS ABS: 0 10*3/uL (ref 0.0–0.1)
Basophils Relative: 0.5 % (ref 0.0–3.0)
EOS PCT: 2.8 % (ref 0.0–5.0)
Eosinophils Absolute: 0.1 10*3/uL (ref 0.0–0.7)
HEMATOCRIT: 43 % (ref 39.0–52.0)
Hemoglobin: 14.6 g/dL (ref 13.0–17.0)
LYMPHS PCT: 51.7 % — AB (ref 12.0–46.0)
Lymphs Abs: 2.7 10*3/uL (ref 0.7–4.0)
MCHC: 34.1 g/dL (ref 30.0–36.0)
MCV: 94.5 fl (ref 78.0–100.0)
MONOS PCT: 8.6 % (ref 3.0–12.0)
Monocytes Absolute: 0.4 10*3/uL (ref 0.1–1.0)
NEUTROS ABS: 1.9 10*3/uL (ref 1.4–7.7)
Neutrophils Relative %: 36.4 % — ABNORMAL LOW (ref 43.0–77.0)
PLATELETS: 252 10*3/uL (ref 150.0–400.0)
RBC: 4.55 Mil/uL (ref 4.22–5.81)
RDW: 12.6 % (ref 11.5–15.5)
WBC: 5.1 10*3/uL (ref 4.0–10.5)

## 2017-10-21 LAB — PSA: PSA: 0.48 ng/mL (ref 0.10–4.00)

## 2017-10-21 NOTE — Progress Notes (Signed)
Subjective:    Patient ID: Travis Greene, male    DOB: [birth year], 52 y.o.   MRN: [id number]  DOS:  10/21/2017 Type of visit - description : cpx Interval history: Here for CPX, no major concerns, see ROS   Review of Systems Report that sometimes he feels "anxiety in the stomach".  Symptoms are vague.  Reports that he occasionally feels stressed and that is when something happens "in the stomach". Denies abdominal pain per se, no nausea, vomiting, diarrhea.  No blood in the stools. Sleeps well, no depression. He has a remote history of substance abuse, currently with no problems, occasionally have a drink, if he smokes marijuana is less than once a year. No chest pain or difficulty breathing No dysuria or gross hematuria.  Other than above, a 14 point review of systems is negative    Past Medical History:  Diagnosis Date  . Chest pain    negative stress test 11/2009  . ED (erectile dysfunction) 11/09  . Hyperthyroidism 12/30/2011  . Ulcerative colitis Dx 2005   [facility], last Cscope 2010 Dr Oletta Lamas, was Rx lialda    Past Surgical History:  Procedure Laterality Date  . APPENDECTOMY    . VASECTOMY      Social History   Socioeconomic History  . Marital status: Married    Spouse name: Not on file  . Number of children: 5  . Years of education: Not on file  . Highest education level: Not on file  Occupational History  . Occupation:  Building control surveyor   Social Needs  . Financial resource strain: Not on file  . Food insecurity:    Worry: Not on file    Inability: Not on file  . Transportation needs:    Medical: Not on file    Non-medical: Not on file  Tobacco Use  . Smoking status: Former Smoker    Packs/day: 1.00    Years: 2.00    Pack years: 2.00    Types: Cigarettes    Last attempt to quit: 11/06/1997    Years since quitting: 19.9  . Smokeless tobacco: Never Used  Substance and Sexual Activity  . Alcohol use: Yes    Comment: socially  . Drug use: No   Comment: multiple drugs before, now rarely uses marijuana  . Sexual activity: Not on file  Lifestyle  . Physical activity:    Days per week: Not on file    Minutes per session: Not on file  . Stress: Not on file  Relationships  . Social connections:    Talks on phone: Not on file    Gets together: Not on file    Attends religious service: Not on file    Active member of club or organization: Not on file    Attends meetings of clubs or organizations: Not on file    Relationship status: Not on file  . Intimate partner violence:    Fear of current or ex partner: Not on file    Emotionally abused: Not on file    Physically abused: Not on file    Forced sexual activity: Not on file  Other Topics Concern  . Not on file  Social History Narrative   [name], from [place] w/ wife and 3 of his 5 children     Family History  Problem Relation Age of Onset  . Diabetes Mother   . Hypertension Mother        ?  . Coronary artery  disease Neg Hx   . Prostate cancer Neg Hx   . Colon cancer Neg Hx      Allergies as of 10/21/2017   No Known Allergies     Medication List        Accurate as of 10/21/17  5:53 PM. Always use your most recent med list.          HUMIRA PEN-CD/UC/HS STARTER 80 MG/0.8ML Pnkt Generic drug:  Adalimumab Take as directed   mesalamine 1.2 g EC tablet Commonly known as:  LIALDA Take 1,200 mg by mouth 4 (four) times daily.          Objective:   Physical Exam BP 134/82 (BP Location: Left Arm, Patient Position: Sitting, Cuff Size: Normal)   Pulse 63   Temp 97.7 F (36.5 C) (Oral)   Resp 14   Ht 5' 8"  (1.727 m)   Wt 234 lb 2 oz (106.2 kg)   SpO2 95%   BMI 35.60 kg/m  General:   Well developed, well nourished . NAD.  Neck: No  thyromegaly  HEENT:  Normocephalic . Greene symmetric, atraumatic Lungs:  CTA B Normal respiratory effort, no intercostal retractions, no accessory muscle use. Heart: RRR,  no murmur.  No pretibial edema  bilaterally  Abdomen:  Not distended, soft, non-tender. No rebound or rigidity.   Skin: Exposed areas without rash. Not pale. Not jaundice Rectal: External abnormalities: none. Normal sphincter tone. No rectal masses or tenderness.  Stools not found Prostate: Prostate gland firm and smooth, exam limited by patient discomfort but prostate seems normal Neurologic:  alert & oriented X3.  Speech normal, gait appropriate for age and unassisted Strength symmetric and appropriate for age.  Psych: Cognition and judgment appear intact.  Cooperative with normal attention span and concentration.  Behavior appropriate. No anxious or depressed appearing.     Assessment & Plan:   Assessment Ulcerative colitis, DX 2005, Dr. Oletta Lamas, [place]  2010, on [place] H/o ED Eczema - feet Vit d def dx 11-2015 Chest pain  (-)  stress test 2011 H/o hyperthyroidism Illiterate   PLAN Ulcerative colitis: Last colonoscopy 08-2017, patient reports he is now getting Humira Anxiety?  See ROS, first step is to seek counseling, information about a International aid/development worker provided.  To call if symptoms escalate. RTC 1 year

## 2017-10-21 NOTE — Patient Instructions (Signed)
GO TO THE LAB : Get the blood work     GO TO THE FRONT DESK Schedule your next appointment   for a physical exam in 1 year 

## 2017-10-21 NOTE — Assessment & Plan Note (Signed)
Ulcerative colitis: Last colonoscopy 08-2017, patient reports he is now getting Humira Anxiety?  See ROS, first step is to seek counseling, information about a International aid/development worker provided.  To call if symptoms escalate. RTC 1 year

## 2017-10-21 NOTE — Progress Notes (Signed)
Pre visit review using our clinic review tool, if applicable. No additional management support is needed unless otherwise documented below in the visit note. 

## 2017-10-21 NOTE — Assessment & Plan Note (Signed)
-  Td 08 - DRE today limited but normal , check a PSA -Cscope 2010, cscope 08/2017: serrated polyp, + chronic active colitis, 3 years  -Diet and exercise discussed -Labs: According to [place] had labs 09/20/2017, creatinine 0.8.  Will check a FLP, CBC, TSH and PSA

## 2017-12-21 DIAGNOSIS — K51919 Ulcerative colitis, unspecified with unspecified complications: Secondary | ICD-10-CM | POA: Diagnosis not present

## 2018-02-24 ENCOUNTER — Telehealth: Payer: Self-pay

## 2018-02-24 DIAGNOSIS — B353 Tinea pedis: Secondary | ICD-10-CM

## 2018-02-24 NOTE — Telephone Encounter (Signed)
Copied from [place] [id number]. Topic: Referral - Request >> Feb 24, 2018  1:12 PM Oliver Pila B wrote: Reason for CRM: pt is needing a referral for a dermatologist for foot fungus; pt is in need to see someone as fast as possible, contact pt if needed

## 2018-02-24 NOTE — Telephone Encounter (Signed)
Referral placed.

## 2018-03-03 ENCOUNTER — Encounter: Payer: Self-pay | Admitting: Internal Medicine

## 2018-03-03 ENCOUNTER — Ambulatory Visit: Payer: BLUE CROSS/BLUE SHIELD | Admitting: Internal Medicine

## 2018-03-03 VITALS — BP 126/82 | HR 84 | Temp 97.9°F | Resp 16 | Ht 68.0 in | Wt 240.0 lb

## 2018-03-03 DIAGNOSIS — B351 Tinea unguium: Secondary | ICD-10-CM

## 2018-03-03 DIAGNOSIS — B353 Tinea pedis: Secondary | ICD-10-CM

## 2018-03-03 LAB — COMPREHENSIVE METABOLIC PANEL
ALK PHOS: 59 U/L (ref 39–117)
ALT: 34 U/L (ref 0–53)
AST: 18 U/L (ref 0–37)
Albumin: 4.3 g/dL (ref 3.5–5.2)
BUN: 14 mg/dL (ref 6–23)
CO2: 31 mEq/L (ref 19–32)
Calcium: 9.6 mg/dL (ref 8.4–10.5)
Chloride: 102 mEq/L (ref 96–112)
Creatinine, Ser: 0.93 mg/dL (ref 0.40–1.50)
GFR: 89.89 mL/min (ref >60.00)
Glucose, Bld: 102 mg/dL — ABNORMAL HIGH (ref 70–99)
Potassium: 4.9 mEq/L (ref 3.5–5.1)
SODIUM: 138 meq/L (ref 135–145)
TOTAL PROTEIN: 7.1 g/dL (ref 6.0–8.3)
Total Bilirubin: 0.7 mg/dL (ref 0.2–1.2)

## 2018-03-03 MED ORDER — CICLOPIROX OLAMINE 0.77 % EX CREA: TOPICAL_CREAM | Freq: Two times a day (BID) | CUTANEOUS | 0 refills | Status: DC

## 2018-03-03 MED ORDER — TERBINAFINE HCL 250 MG PO TABS: 250.0000 mg | ORAL_TABLET | Freq: Every day | ORAL | 0 refills | Status: DC

## 2018-03-03 NOTE — Progress Notes (Signed)
Pre visit review using our clinic review tool, if applicable. No additional management support is needed unless otherwise documented below in the visit note. 

## 2018-03-03 NOTE — Progress Notes (Signed)
Subjective:    Patient ID: Travis Greene, male    DOB: [birth year], 52 y.o.   MRN: [id number]  DOS:  03/03/2018 Type of visit - description : Acute visit Interval history: Ongoing problems with a rash on the feet. Saw dermatology in 2016, will get records. Was referred to dermatology more recently but  will be unable to see them until December consequently he has not to schedule an appointment.   Review of Systems No fever chills + Itching on and off  Past Medical History:  Diagnosis Date  . Chest pain    negative stress test 11/2009  . ED (erectile dysfunction) 11/09  . Hyperthyroidism 12/30/2011  . Ulcerative colitis Dx 2005   [facility], last Cscope 2010 Dr Oletta Lamas, was Rx lialda    Past Surgical History:  Procedure Laterality Date  . APPENDECTOMY    . VASECTOMY      Social History   Socioeconomic History  . Marital status: Married    Spouse name: Not on file  . Number of children: 5  . Years of education: Not on file  . Highest education level: Not on file  Occupational History  . Occupation:  Building control surveyor   Social Needs  . Financial resource strain: Not on file  . Food insecurity:    Worry: Not on file    Inability: Not on file  . Transportation needs:    Medical: Not on file    Non-medical: Not on file  Tobacco Use  . Smoking status: Former Smoker    Packs/day: 1.00    Years: 2.00    Pack years: 2.00    Types: Cigarettes    Last attempt to quit: 11/06/1997    Years since quitting: 20.3  . Smokeless tobacco: Never Used  Substance and Sexual Activity  . Alcohol use: Yes    Comment: socially  . Drug use: No    Comment: multiple drugs before, now rarely uses marijuana  . Sexual activity: Not on file  Lifestyle  . Physical activity:    Days per week: Not on file    Minutes per session: Not on file  . Stress: Not on file  Relationships  . Social connections:    Talks on phone: Not on file    Gets together: Not on file    Attends religious  service: Not on file    Active member of club or organization: Not on file    Attends meetings of clubs or organizations: Not on file    Relationship status: Not on file  . Intimate partner violence:    Fear of current or ex partner: Not on file    Emotionally abused: Not on file    Physically abused: Not on file    Forced sexual activity: Not on file  Other Topics Concern  . Not on file  Social History Narrative   [name], from [place] w/ wife and 3 of his 5 children      Allergies as of 03/03/2018   No Known Allergies     Medication List        Accurate as of 03/03/18 11:59 PM. Always use your most recent med list.          ciclopirox 0.77 % cream Commonly known as:  LOPROX Apply topically 2 (two) times daily.   HUMIRA PEN-CD/UC/HS STARTER 80 MG/0.8ML Pnkt Generic drug:  Adalimumab Take as directed   mesalamine 1.2 g EC tablet Commonly known as:  LIALDA Take 1,200 mg by mouth 4 (four) times daily.   terbinafine 250 MG tablet Commonly known as:  LAMISIL Take 1 tablet (250 mg total) by mouth daily.          Objective:   Physical Exam BP 126/82 (BP Location: Left Arm, Patient Position: Sitting, Cuff Size: Normal)   Pulse 84   Temp 97.9 F (36.6 C) (Oral)   Resp 16   Ht 5' 8"  (1.727 m)   Wt 240 lb (108.9 kg)   SpO2 93%   BMI 36.49 kg/m  General:   Well developed, NAD, see BMI.  HEENT:  Normocephalic . Face symmetric, atraumatic  Skin: Severe rash on the feet, see picture.  + Maceration between the toes, some nails are dystrophic. Neurologic:  alert & oriented X3.  Speech normal, gait appropriate for age and unassisted Psych--  Cognition and judgment appear intact.  Cooperative with normal attention span and concentration.  Behavior appropriate. No anxious or depressed appearing.          Assessment & Plan:  Assessment Ulcerative colitis, DX 2005, Dr. Oletta Lamas, [place]  2010, on [place] H/o ED Eczema - feet Vit d def dx  11-2015 Chest pain  (-)  stress test 2011 H/o hyperthyroidism Illiterate   PLAN Tinea pedis, onychomycosis. KOH and culture sent from the third right toenail Start treatment with ciclopirox topical and given severity of tinea pedis will do Lamisil for 2 weeks. Check a CMP. If culture confirmed onychomycosis will need Lamisil for 3 months. All discussed in Spanish with the patient who verbalized understanding. RTC 4 weeks

## 2018-03-03 NOTE — Patient Instructions (Signed)
  GO TO THE FRONT DESK Schedule your next appointment for a follow-up in 4 weeks  USE LA CREMA 2 VECES AL DIA  TOME LA MEDICINA ORAL UNA VEZ AL DIA X 2 SEMANAS  PARA LA PICAZON: OVER-THE-COUNTER HYDROCORTISONE 1% 2 VECES AL DIA

## 2018-03-04 NOTE — Assessment & Plan Note (Signed)
Tinea pedis, onychomycosis. KOH and culture sent from the third right toenail Start treatment with ciclopirox topical and given severity of tinea pedis will do Lamisil for 2 weeks. Check a CMP. If culture confirmed onychomycosis will need Lamisil for 3 months. All discussed in Spanish with the patient who verbalized understanding. RTC 4 weeks

## 2018-03-08 NOTE — Telephone Encounter (Signed)
FYI- sent referral to [facility] to see if patient can be seen sooner.

## 2018-03-11 ENCOUNTER — Other Ambulatory Visit: Payer: Self-pay | Admitting: Internal Medicine

## 2018-03-24 DIAGNOSIS — B353 Tinea pedis: Secondary | ICD-10-CM | POA: Diagnosis not present

## 2018-03-24 DIAGNOSIS — M79672 Pain in left foot: Secondary | ICD-10-CM | POA: Diagnosis not present

## 2018-03-24 DIAGNOSIS — M79671 Pain in right foot: Secondary | ICD-10-CM | POA: Diagnosis not present

## 2018-03-24 DIAGNOSIS — B351 Tinea unguium: Secondary | ICD-10-CM | POA: Diagnosis not present

## 2018-04-03 LAB — CULT, FUNGUS, SKIN,HAIR,NAIL W/KOH
MICRO NUMBER:: 90916150
SMEAR: NONE SEEN
SPECIMEN QUALITY: ADEQUATE

## 2018-04-10 DIAGNOSIS — M79671 Pain in right foot: Secondary | ICD-10-CM | POA: Diagnosis not present

## 2018-04-10 DIAGNOSIS — M79672 Pain in left foot: Secondary | ICD-10-CM | POA: Diagnosis not present

## 2018-04-10 DIAGNOSIS — B353 Tinea pedis: Secondary | ICD-10-CM | POA: Diagnosis not present

## 2018-04-10 DIAGNOSIS — B351 Tinea unguium: Secondary | ICD-10-CM | POA: Diagnosis not present

## 2018-04-25 DIAGNOSIS — K5190 Ulcerative colitis, unspecified, without complications: Secondary | ICD-10-CM | POA: Diagnosis not present

## 2018-05-05 ENCOUNTER — Other Ambulatory Visit: Payer: Self-pay | Admitting: Internal Medicine

## 2018-06-12 DIAGNOSIS — M79671 Pain in right foot: Secondary | ICD-10-CM | POA: Diagnosis not present

## 2018-06-12 DIAGNOSIS — M79672 Pain in left foot: Secondary | ICD-10-CM | POA: Diagnosis not present

## 2018-06-12 DIAGNOSIS — B351 Tinea unguium: Secondary | ICD-10-CM | POA: Diagnosis not present

## 2018-06-12 DIAGNOSIS — B353 Tinea pedis: Secondary | ICD-10-CM | POA: Diagnosis not present

## 2018-06-20 DIAGNOSIS — R12 Heartburn: Secondary | ICD-10-CM | POA: Diagnosis not present

## 2018-06-20 DIAGNOSIS — K5190 Ulcerative colitis, unspecified, without complications: Secondary | ICD-10-CM | POA: Diagnosis not present

## 2018-06-23 ENCOUNTER — Encounter: Payer: Self-pay | Admitting: Neurology

## 2018-06-23 ENCOUNTER — Ambulatory Visit: Payer: BLUE CROSS/BLUE SHIELD | Admitting: Neurology

## 2018-06-23 VITALS — BP 118/78 | HR 70 | Resp 18 | Ht 68.0 in | Wt 229.0 lb

## 2018-06-23 DIAGNOSIS — E538 Deficiency of other specified B group vitamins: Secondary | ICD-10-CM | POA: Diagnosis not present

## 2018-06-23 DIAGNOSIS — R202 Paresthesia of skin: Secondary | ICD-10-CM | POA: Diagnosis not present

## 2018-06-23 MED ORDER — GABAPENTIN 300 MG PO CAPS: 300.0000 mg | ORAL_CAPSULE | Freq: Two times a day (BID) | ORAL | 3 refills | Status: DC

## 2018-06-23 NOTE — Patient Instructions (Signed)
We will start gabapentin for the foot discomfort.   Neurontin (gabapentin) may result in drowsiness, ankle swelling, gait instability, or possibly dizziness. Please contact our office if significant side effects occur with this medication.

## 2018-06-23 NOTE — Progress Notes (Signed)
Reason for visit: Paresthesias  Referring physician: Dr. Urban Travis Greene is a 52 y.o. male  History of present illness:  Travis Greene is a 52 year old right-handed Hispanic male, non-English-speaking.  He comes in today with his wife and with an interpreter.  He was referred through his podiatrist for evaluation of unusual sensations in the feet that been present for about 2 years.  The patient reports a crawling sensation in both feet symmetrically, not going above the ankles.  The patient has no true numbness, but the sensations are somewhat uncomfortable for him.  He may have some occasional slight numbness in the left arm at times.  He denies any neck or low back pain or pain down the legs.  He has a generalized problem with fatigue, he has been told he may have colitis and is being evaluated for this.  The patient denies any weakness of the arms or legs or difficulty with balance.  He is able to control the bladder fairly well but he does have some urinary urgency at times.  He denies any unusual sensations with the hands per se.  He is referred to this office for further evaluation of the above symptoms.  The sensation in the feet is present at all times, it does not vary when up on his feet or when he is resting.  Past Medical History:  Diagnosis Date  . Chest pain    negative stress test 11/2009  . Colitis   . ED (erectile dysfunction) 11/09  . Hyperthyroidism 12/30/2011  . Ulcerative colitis Dx 2005   [facility], last Cscope 2010 Dr Oletta Lamas, was Rx lialda  . Vision abnormalities     Past Surgical History:  Procedure Laterality Date  . APPENDECTOMY    . VASECTOMY      Family History  Problem Relation Age of Onset  . Diabetes Mother   . Hypertension Mother        ?  . Coronary artery disease Neg Hx   . Prostate cancer Neg Hx   . Colon cancer Neg Hx     Social history:  reports that he quit smoking about 20 years ago. His smoking use included cigarettes. He has a  2.00 pack-year smoking history. He has never used smokeless tobacco. He reports that he drinks alcohol. He reports that he does not use drugs.  Medications:  Prior to Admission medications   Medication Sig Start Date End Date Taking? Authorizing Provider  ciclopirox (LOPROX) 0.77 % cream Apply topically 2 (two) times daily. 03/03/18  Yes Colon Branch, MD  HUMIRA PEN-CD/UC/HS STARTER 80 MG/0.8ML PNKT Take as directed 09/30/17  Yes [provider]  terbinafine (LAMISIL) 250 MG tablet Take 1 tablet (250 mg total) by mouth daily. 03/03/18  Yes Colon Branch, MD  mesalamine (LIALDA) 1.2 g EC tablet Take 4 tablets (4.8 g total) by mouth daily with breakfast. Patient not taking: Reported on 06/23/2018 05/05/18   Colon Branch, MD     No Known Allergies  ROS:  Out of a complete 14 system review of symptoms, the patient complains only of the following symptoms, and all other reviewed systems are negative.  Generalized fatigue Paresthesias  Blood pressure 118/78, pulse 70, resp. rate 18, height 5' 8"  (1.727 m), weight 229 lb (103.9 kg).  Physical Exam  General: The patient is alert and cooperative at the time of the examination.  The patient is moderately obese.  Eyes: Pupils are equal, round, and reactive  to light. Discs are flat bilaterally.  Neck: The neck is supple, no carotid bruits are noted.  Respiratory: The respiratory examination is clear.  Cardiovascular: The cardiovascular examination reveals a regular rate and rhythm, no obvious murmurs or rubs are noted.  Skin: Extremities are without significant edema.  Neurologic Exam  Mental status: The patient is alert and oriented x 3 at the time of the examination. The patient has apparent normal recent and remote memory, with an apparently normal attention span and concentration ability.  Cranial nerves: Facial symmetry is present. There is good sensation of the face to pinprick and soft touch bilaterally. The strength of the facial  muscles and the muscles to head turning and shoulder shrug are normal bilaterally. Speech is well enunciated, no aphasia or dysarthria is noted. Extraocular movements are full. Visual fields are full. The tongue is midline, and the patient has symmetric elevation of the soft palate. No obvious hearing deficits are noted.  Motor: The motor testing reveals 5 over 5 strength of all 4 extremities. Good symmetric motor tone is noted throughout.  Sensory: Sensory testing is intact to pinprick, soft touch, vibration sensation, and position sense on all 4 extremities.  No evidence of a stocking pattern pinprick sensory deficit in the legs as noted.  No evidence of extinction is noted.  Coordination: Cerebellar testing reveals good finger-nose-finger and heel-to-shin bilaterally.  Gait and station: Gait is normal. Tandem gait is normal. Romberg is negative. No drift is seen.  Reflexes: Deep tendon reflexes are symmetric and normal bilaterally.  The ankle jerk reflexes are present bilaterally, there may be 1 or 2 beats of ankle clonus on the right.  Toes are downgoing bilaterally.   Assessment/Plan:  1.  Paresthesias, bilateral feet  2.  Generalized fatigue  The patient will have blood work today.  He will be placed on gabapentin taking 300 mg twice daily.  He will follow-up for nerve conduction studies of both legs and EMG on one leg.  He otherwise will follow-up in 4 months.  The etiology of his symptoms is not clear, the patient appears to have well-maintained reflexes in the ankles bilaterally.  [name] 06/23/2018 10:34 AM  [facility] [address] [place], [place] [address]  Phone [phone] Fax [phone]

## 2018-06-26 DIAGNOSIS — K2930 Chronic superficial gastritis without bleeding: Secondary | ICD-10-CM | POA: Diagnosis not present

## 2018-06-26 DIAGNOSIS — K219 Gastro-esophageal reflux disease without esophagitis: Secondary | ICD-10-CM | POA: Diagnosis not present

## 2018-06-26 DIAGNOSIS — K5190 Ulcerative colitis, unspecified, without complications: Secondary | ICD-10-CM | POA: Diagnosis not present

## 2018-06-26 DIAGNOSIS — K269 Duodenal ulcer, unspecified as acute or chronic, without hemorrhage or perforation: Secondary | ICD-10-CM | POA: Diagnosis not present

## 2018-06-26 DIAGNOSIS — R110 Nausea: Secondary | ICD-10-CM | POA: Diagnosis not present

## 2018-06-26 DIAGNOSIS — R12 Heartburn: Secondary | ICD-10-CM | POA: Diagnosis not present

## 2018-06-26 DIAGNOSIS — K529 Noninfective gastroenteritis and colitis, unspecified: Secondary | ICD-10-CM | POA: Diagnosis not present

## 2018-06-26 DIAGNOSIS — K5289 Other specified noninfective gastroenteritis and colitis: Secondary | ICD-10-CM | POA: Diagnosis not present

## 2018-06-26 LAB — HM SIGMOIDOSCOPY

## 2018-06-29 LAB — MULTIPLE MYELOMA PANEL, SERUM
ALPHA2 GLOB SERPL ELPH-MCNC: 0.5 g/dL (ref 0.4–1.0)
Albumin SerPl Elph-Mcnc: 3.8 g/dL (ref 2.9–4.4)
Albumin/Glob SerPl: 1.4 (ref 0.7–1.7)
Alpha 1: 0.3 g/dL (ref 0.0–0.4)
B-Globulin SerPl Elph-Mcnc: 1.2 g/dL (ref 0.7–1.3)
GAMMA GLOB SERPL ELPH-MCNC: 1 g/dL (ref 0.4–1.8)
Globulin, Total: 2.9 g/dL (ref 2.2–3.9)
IGM (IMMUNOGLOBULIN M), SRM: 112 mg/dL (ref 20–172)
IgA/Immunoglobulin A, Serum: 387 mg/dL — ABNORMAL HIGH (ref 90–386)
IgG (Immunoglobin G), Serum: 1138 mg/dL (ref 700–1600)
Total Protein: 6.7 g/dL (ref 6.0–8.5)

## 2018-06-29 LAB — RPR: RPR: NONREACTIVE

## 2018-06-29 LAB — VITAMIN B12: Vitamin B-12: >2000 pg/mL — ABNORMAL HIGH (ref 232–1245)

## 2018-06-29 LAB — ANGIOTENSIN CONVERTING ENZYME: ANGIO CONVERT ENZYME: 61 U/L (ref 14–82)

## 2018-06-29 LAB — SEDIMENTATION RATE: Sed Rate: 13 mm/hr (ref 0–30)

## 2018-06-29 LAB — HIV ANTIBODY (ROUTINE TESTING W REFLEX): HIV SCREEN 4TH GENERATION: NONREACTIVE

## 2018-06-29 LAB — RHEUMATOID FACTOR

## 2018-06-29 LAB — ANA W/REFLEX: Anti Nuclear Antibody(ANA): NEGATIVE

## 2018-06-29 LAB — B. BURGDORFI ANTIBODIES: Lyme IgG/IgM Ab: <0.91 {ISR} (ref 0.00–0.90)

## 2018-07-03 ENCOUNTER — Telehealth: Payer: Self-pay | Admitting: *Deleted

## 2018-07-03 NOTE — Telephone Encounter (Signed)
-----   Message from Kathrynn Ducking, MD sent at 07/02/2018 10:48 AM EST -----  The blood work results are unremarkable. Please call the patient.  ----- Message ----- From: Lavone Neri Lab Results In Sent: 06/24/2018   7:38 AM EST To: Kathrynn Ducking, MD

## 2018-07-03 NOTE — Telephone Encounter (Signed)
I spoke with pt. this am and reviewed below lab results. He verbalized understanding of same/fim

## 2018-07-05 DIAGNOSIS — K219 Gastro-esophageal reflux disease without esophagitis: Secondary | ICD-10-CM | POA: Diagnosis not present

## 2018-07-05 DIAGNOSIS — K5289 Other specified noninfective gastroenteritis and colitis: Secondary | ICD-10-CM | POA: Diagnosis not present

## 2018-07-05 DIAGNOSIS — K2930 Chronic superficial gastritis without bleeding: Secondary | ICD-10-CM | POA: Diagnosis not present

## 2018-07-20 ENCOUNTER — Encounter: Payer: Self-pay | Admitting: Internal Medicine

## 2018-07-28 DIAGNOSIS — K529 Noninfective gastroenteritis and colitis, unspecified: Secondary | ICD-10-CM | POA: Diagnosis not present

## 2018-07-28 DIAGNOSIS — Z6834 Body mass index (BMI) 34.0-34.9, adult: Secondary | ICD-10-CM | POA: Diagnosis not present

## 2018-07-28 DIAGNOSIS — K5100 Ulcerative (chronic) pancolitis without complications: Secondary | ICD-10-CM | POA: Diagnosis not present

## 2018-08-07 ENCOUNTER — Encounter: Payer: Self-pay | Admitting: Neurology

## 2018-08-07 ENCOUNTER — Ambulatory Visit: Payer: BLUE CROSS/BLUE SHIELD | Admitting: Neurology

## 2018-08-07 ENCOUNTER — Ambulatory Visit (INDEPENDENT_AMBULATORY_CARE_PROVIDER_SITE_OTHER): Payer: BLUE CROSS/BLUE SHIELD | Admitting: Neurology

## 2018-08-07 DIAGNOSIS — Z0289 Encounter for other administrative examinations: Secondary | ICD-10-CM

## 2018-08-07 DIAGNOSIS — R202 Paresthesia of skin: Secondary | ICD-10-CM

## 2018-08-07 NOTE — Procedures (Signed)
     HISTORY:  Travis Greene is a 53 year old Hispanic male with a history of crawling sensations in the feet without associated back pain.  The patient is being evaluated for a possible peripheral neuropathy or a lumbosacral radiculopathy.  Gabapentin has significantly helped his symptoms.  NERVE CONDUCTION STUDIES:  Nerve conduction studies were performed on both lower extremities. The distal motor latencies and motor amplitudes for the peroneal and posterior tibial nerves were within normal limits. The nerve conduction velocities for these nerves were also normal. The sensory latencies for the peroneal and sural nerves were within normal limits. The F wave latencies for the posterior tibial nerves were within normal limits.   EMG STUDIES:  EMG study was performed on the right lower extremity:  The tibialis anterior muscle reveals 2 to 4K motor units with full recruitment. No fibrillations or positive waves were seen. The peroneus tertius muscle reveals 2 to 4K motor units with full recruitment. No fibrillations or positive waves were seen. The medial gastrocnemius muscle reveals 1 to 3K motor units with full recruitment. No fibrillations or positive waves were seen. The vastus lateralis muscle reveals 2 to 4K motor units with full recruitment. No fibrillations or positive waves were seen. The iliopsoas muscle reveals 2 to 4K motor units with full recruitment. No fibrillations or positive waves were seen. The biceps femoris muscle (long head) reveals 2 to 4K motor units with full recruitment. No fibrillations or positive waves were seen. The lumbosacral paraspinal muscles were tested at 3 levels, and revealed no abnormalities of insertional activity at all 3 levels tested. There was good relaxation.   IMPRESSION:  Nerve conduction studies were performed on both lower extremities.  These studies were completely normal without evidence of an underlying peripheral neuropathy.  A small  fiber neuropathy or an early peripheral neuropathy can be missed by standard nerve conduction studies, however.  Clinical correlation is required.  EMG evaluation of the right lower extremity is unremarkable, no evidence of an overlying lumbosacral radiculopathy is seen.  [name] 08/07/2018 11:24 AM  [facility] [address] [place], [place] [address]  Phone [phone] Fax [phone]

## 2018-08-07 NOTE — Progress Notes (Signed)
Please refer to EMG and nerve conduction procedure note.  

## 2018-08-07 NOTE — Progress Notes (Addendum)
The patient comes in for EMG nerve conduction study evaluation today.  He has a crawling sensation in the feet that has been completely eliminated with the use of the gabapentin taking 300 mg twice daily.  The crawling sensation is not associated with any back or leg pain, and is present with walking, standing, sitting, or lying down.  The cause of the sensation is not clear, it is not consistent with restless leg syndrome, the possibility of a small fiber neuropathy or an early peripheral neuropathy does need to be considered.  The patient will stay on gabapentin for now.     [place]    Nerve / Sites Muscle Latency Ref. Amplitude Ref. Rel Amp Segments Distance Velocity Ref. Area    ms ms mV mV %  cm m/s m/s mVms  R Peroneal - EDB     Ankle EDB 4.7 ?6.5 5.4 ?2.0 100 Ankle - EDB 9   12.0     Fib head EDB 11.8  4.9  91.5 Fib head - Ankle 33 46 ?44 12.1     Pop fossa EDB 13.8  4.7  95.7 Pop fossa - Fib head 10 52 ?44 11.8         Pop fossa - Ankle      L Peroneal - EDB     Ankle EDB 4.3 ?6.5 4.6 ?2.0 100 Ankle - EDB 9   12.8     Fib head EDB 11.7  4.3  92.4 Fib head - Ankle 34 46 ?44 12.5     Pop fossa EDB 13.7  4.0  93.8 Pop fossa - Fib head 10 51 ?44 12.9         Pop fossa - Ankle      R Tibial - AH     Ankle AH 3.9 ?5.8 5.1 ?4.0 100 Ankle - AH 9   11.5     Pop fossa AH 12.7  3.6  71.3 Pop fossa - Ankle 39 45 ?41 11.3  L Tibial - AH     Ankle AH 3.9 ?5.8 5.5 ?4.0 100 Ankle - AH 9   12.3     Pop fossa AH 12.8  3.0  55.3 Pop fossa - Ankle 37 41 ?41 7.7             SNC    Nerve / Sites Rec. Site Peak Lat Ref.  Amp Ref. Segments Distance    ms ms V V  cm  R Sural - Ankle (Calf)     Calf Ankle 3.1 ?4.4 16 ?6 Calf - Ankle 14  L Sural - Ankle (Calf)     Calf Ankle 3.5 ?4.4 8 ?6 Calf - Ankle 14  R Superficial peroneal - Ankle     Lat leg Ankle 3.8 ?4.4 7 ?6 Lat leg - Ankle 14  L Superficial peroneal - Ankle     Lat leg Ankle 3.8 ?4.4 6 ?6 Lat leg - Ankle 14              F  Wave     Nerve F Lat Ref.   ms ms  R Tibial - AH 54.1 ?56.0  L Tibial - AH 55.7 ?56.0

## 2018-08-17 DIAGNOSIS — K5100 Ulcerative (chronic) pancolitis without complications: Secondary | ICD-10-CM | POA: Diagnosis not present

## 2018-10-17 ENCOUNTER — Other Ambulatory Visit: Payer: Self-pay

## 2018-10-17 MED ORDER — GABAPENTIN 300 MG PO CAPS: 300.0000 mg | ORAL_CAPSULE | Freq: Two times a day (BID) | ORAL | 3 refills | Status: DC

## 2018-10-25 ENCOUNTER — Ambulatory Visit: Payer: BLUE CROSS/BLUE SHIELD | Admitting: Neurology

## 2018-11-13 ENCOUNTER — Encounter: Payer: BLUE CROSS/BLUE SHIELD | Admitting: Internal Medicine

## 2018-11-27 ENCOUNTER — Telehealth: Payer: Self-pay

## 2018-11-27 NOTE — Telephone Encounter (Signed)
Called pt and Mail Box full. Need to call again

## 2018-11-27 NOTE — Telephone Encounter (Signed)
Spoke with pt and informed him about VOV for CPE, pt stated doesn't want to schedule CPE yet but will call us to schedule the appt, pt just wanted also to inform if he gets an appt to have provider plenty of time on his appt schedule since pt wants to talk with provider more time then usual.

## 2018-11-27 NOTE — Telephone Encounter (Signed)
Travis Greene- trying to see if Pt would be interested in cpe virtually. Has [employer] still? Can you reach out to him and offer please?

## 2018-12-12 ENCOUNTER — Ambulatory Visit (INDEPENDENT_AMBULATORY_CARE_PROVIDER_SITE_OTHER): Payer: BLUE CROSS/BLUE SHIELD | Admitting: Internal Medicine

## 2018-12-12 ENCOUNTER — Other Ambulatory Visit: Payer: Self-pay

## 2018-12-12 DIAGNOSIS — B349 Viral infection, unspecified: Secondary | ICD-10-CM

## 2018-12-12 NOTE — Progress Notes (Signed)
Subjective:    Patient ID: Travis Greene, male    DOB: 02-Jun-1966, 53 y.o.   MRN: [id number]  DOS:  12/12/2018 Type of visit - description: Virtual Visit via Video Note  I connected with@ on 12/12/18 at  4:00 PM EDT by a video enabled telemedicine application and verified that I am speaking with the correct person using two identifiers.   THIS ENCOUNTER IS A VIRTUAL VISIT DUE TO COVID-19 - PATIENT WAS NOT SEEN IN THE OFFICE. PATIENT HAS CONSENTED TO VIRTUAL VISIT / TELEMEDICINE VISIT   Location of patient: home  Location of provider: office  I discussed the limitations of evaluation and management by telemedicine and the availability of in person appointments. The patient expressed understanding and agreed to proceed.  History of Present Illness: Acute visit Symptoms of started 12/09/2018: Subjective fever, malaise, fatigue, feeling cold. Wife has some of the same symptoms but no fatigue. 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 Travis Greene is mostly not in contact with customers except once or twice a week.    Review of Systems Has not checked his temperature. Appetite decreased Denies sinus pain or congestion No chest pain no difficulty breathing + Generalized myalgias No nausea, vomiting, diarrhea. No cough No rash or headache  Past Medical History:  Diagnosis Date  . Chest pain    negative stress test 11/2009  . Colitis   . ED (erectile dysfunction) 11/09  . Hyperthyroidism 12/30/2011  . Ulcerative colitis Dx 2005   [facility], last Cscope 2010 Dr Oletta Lamas, was Rx lialda  . Vision abnormalities     Past Surgical History:  Procedure Laterality Date  . APPENDECTOMY    . VASECTOMY      Social History   Socioeconomic History  . Marital status: Married    Spouse name: Not on file  . Number of children: 5  . Years of education: Not on file  . Highest education level: Not on file  Occupational  History  . Occupation:  Building control surveyor   Social Needs  . Financial resource strain: Not on file  . Food insecurity:    Worry: Not on file    Inability: Not on file  . Transportation needs:    Medical: Not on file    Non-medical: Not on file  Tobacco Use  . Smoking status: Former Smoker    Packs/day: 1.00    Years: 2.00    Pack years: 2.00    Types: Cigarettes    Last attempt to quit: 11/06/1997    Years since quitting: 21.1  . Smokeless tobacco: Never Used  Substance and Sexual Activity  . Alcohol use: Yes    Comment: socially  . Drug use: No    Comment: multiple drugs before, now rarely uses marijuana  . Sexual activity: Not on file  Lifestyle  . Physical activity:    Days per week: Not on file    Minutes per session: Not on file  . Stress: Not on file  Relationships  . Social connections:    Talks on phone: Not on file    Gets together: Not on file    Attends religious service: Not on file    Active member of club or organization: Not on file    Attends meetings of clubs or organizations: Not on file    Relationship status: Not on file  . Intimate partner violence:    Fear of  current or ex partner: Not on file    Emotionally abused: Not on file    Physically abused: Not on file    Forced sexual activity: Not on file  Other Topics Concern  . Not on file  Social History Narrative   [name], [name]      Allergies as of 12/12/2018   No Known Allergies     Medication List       Accurate as of Dec 12, 2018  3:44 PM. If you have any questions, ask your nurse or doctor.        ciclopirox 0.77 % cream Commonly known as:  LOPROX Apply topically 2 (two) times daily.   gabapentin 300 MG capsule Commonly known as:  NEURONTIN Take 1 capsule (300 mg total) by mouth 2 (two) times daily.   Humira Pen-CD/UC/HS Starter 80 MG/0.8ML Pnkt Generic drug:  Adalimumab Take as directed   mesalamine 1.2 g EC tablet  Commonly known as:  LIALDA Take 4 tablets (4.8 g total) by mouth daily with breakfast.   terbinafine 250 MG tablet Commonly known as:  LamISIL Take 1 tablet (250 mg total) by mouth daily.           Objective:   Physical Exam There were no vitals taken for this visit. This is a virtual visit, he is alert oriented x3 in no distress    Assessment     Assessment Ulcerative colitis, DX 2005, Dr. Oletta Lamas, [place]  2010, on [place] H/o ED Eczema - feet Vit d def dx 11-2015 Chest pain  (-)  stress test 2011 H/o hyperthyroidism Illiterate   PLAN Viral syndrome Sx c/w a viral syndrome, possibly COVID-19.  He is not toxic appearing and has no red flag symptoms. Recommend home treatment: Rest, take plenty of fluids, Tylenol as needed, check his temperature twice a day.  Patient plans to get a thermometer. ER if: Severe symptoms, chest pain, high fever, headache, difficulty breathing. He will stop being contagious in 10 days and if no fever for 3 days and if he feels better. He verbalized understanding to all of the above. Knows to call me if needed. Ulcerative colitis: On mesalamine, not taking Humira   I discussed the assessment and treatment plan with the patient. The patient was provided an opportunity to ask questions and all were answered. The patient agreed with the plan and demonstrated an understanding of the instructions.   The patient was advised to call back or seek an in-person evaluation if the symptoms worsen or if the condition fails to improve as anticipated.

## 2018-12-13 NOTE — Assessment & Plan Note (Signed)
Viral syndrome Sx c/w a viral syndrome, possibly COVID-19.  He is not toxic appearing and has no red flag symptoms. Recommend home treatment: Rest, take plenty of fluids, Tylenol as needed, check his temperature twice a day.  Patient plans to get a thermometer. ER if: Severe symptoms, chest pain, high fever, headache, difficulty breathing. He will stop being contagious in 10 days and if no fever for 3 days and if he feels better. He verbalized understanding to all of the above. Knows to call me if needed. Ulcerative colitis: On mesalamine, not taking Humira

## 2018-12-18 ENCOUNTER — Encounter: Payer: Self-pay | Admitting: *Deleted

## 2018-12-18 ENCOUNTER — Other Ambulatory Visit: Payer: Self-pay

## 2018-12-18 ENCOUNTER — Encounter (HOSPITAL_COMMUNITY): Payer: Self-pay

## 2018-12-18 ENCOUNTER — Emergency Department (HOSPITAL_COMMUNITY): Payer: BC Managed Care – PPO

## 2018-12-18 ENCOUNTER — Emergency Department
Admission: EM | Admit: 2018-12-18 | Discharge: 2018-12-18 | Disposition: A | Discharge status: Home / Self Care | Payer: BLUE CROSS/BLUE SHIELD | Source: Home / Self Care | Attending: Family Medicine | Admitting: Family Medicine

## 2018-12-18 ENCOUNTER — Inpatient Hospital Stay (HOSPITAL_COMMUNITY)
Admission: EM | Admit: 2018-12-18 | Discharge: 2018-12-20 | DRG: 177 | Disposition: A | Discharge status: Home / Self Care | Payer: BC Managed Care – PPO | Attending: Internal Medicine | Admitting: Internal Medicine

## 2018-12-18 DIAGNOSIS — F411 Generalized anxiety disorder: Secondary | ICD-10-CM | POA: Diagnosis present

## 2018-12-18 DIAGNOSIS — K0889 Other specified disorders of teeth and supporting structures: Secondary | ICD-10-CM | POA: Diagnosis present

## 2018-12-18 DIAGNOSIS — K5190 Ulcerative colitis, unspecified, without complications: Secondary | ICD-10-CM | POA: Diagnosis not present

## 2018-12-18 DIAGNOSIS — Z79899 Other long term (current) drug therapy: Secondary | ICD-10-CM | POA: Diagnosis not present

## 2018-12-18 DIAGNOSIS — Z79890 Hormone replacement therapy (postmenopausal): Secondary | ICD-10-CM

## 2018-12-18 DIAGNOSIS — Z6835 Body mass index (BMI) 35.0-35.9, adult: Secondary | ICD-10-CM | POA: Diagnosis not present

## 2018-12-18 DIAGNOSIS — J1289 Other viral pneumonia: Secondary | ICD-10-CM | POA: Diagnosis present

## 2018-12-18 DIAGNOSIS — Z20822 Contact with and (suspected) exposure to covid-19: Secondary | ICD-10-CM

## 2018-12-18 DIAGNOSIS — E669 Obesity, unspecified: Secondary | ICD-10-CM | POA: Diagnosis not present

## 2018-12-18 DIAGNOSIS — R509 Fever, unspecified: Secondary | ICD-10-CM

## 2018-12-18 DIAGNOSIS — R0981 Nasal congestion: Secondary | ICD-10-CM

## 2018-12-18 DIAGNOSIS — U071 COVID-19: Principal | ICD-10-CM | POA: Diagnosis present

## 2018-12-18 DIAGNOSIS — G834 Cauda equina syndrome: Secondary | ICD-10-CM | POA: Diagnosis not present

## 2018-12-18 DIAGNOSIS — R0602 Shortness of breath: Secondary | ICD-10-CM | POA: Diagnosis not present

## 2018-12-18 DIAGNOSIS — Z20828 Contact with and (suspected) exposure to other viral communicable diseases: Secondary | ICD-10-CM | POA: Diagnosis not present

## 2018-12-18 DIAGNOSIS — K5100 Ulcerative (chronic) pancolitis without complications: Secondary | ICD-10-CM | POA: Diagnosis not present

## 2018-12-18 DIAGNOSIS — R6883 Chills (without fever): Secondary | ICD-10-CM | POA: Diagnosis not present

## 2018-12-18 DIAGNOSIS — Z87891 Personal history of nicotine dependence: Secondary | ICD-10-CM

## 2018-12-18 DIAGNOSIS — R630 Anorexia: Secondary | ICD-10-CM | POA: Diagnosis present

## 2018-12-18 DIAGNOSIS — J1282 Pneumonia due to coronavirus disease 2019: Secondary | ICD-10-CM | POA: Diagnosis present

## 2018-12-18 DIAGNOSIS — R0603 Acute respiratory distress: Secondary | ICD-10-CM | POA: Diagnosis present

## 2018-12-18 DIAGNOSIS — J9621 Acute and chronic respiratory failure with hypoxia: Secondary | ICD-10-CM | POA: Diagnosis not present

## 2018-12-18 DIAGNOSIS — E039 Hypothyroidism, unspecified: Secondary | ICD-10-CM | POA: Diagnosis present

## 2018-12-18 DIAGNOSIS — R6510 Systemic inflammatory response syndrome (SIRS) of non-infectious origin without acute organ dysfunction: Secondary | ICD-10-CM

## 2018-12-18 DIAGNOSIS — R0600 Dyspnea, unspecified: Secondary | ICD-10-CM

## 2018-12-18 DIAGNOSIS — R918 Other nonspecific abnormal finding of lung field: Secondary | ICD-10-CM | POA: Diagnosis not present

## 2018-12-18 LAB — COMPREHENSIVE METABOLIC PANEL
ALT: 62 U/L — ABNORMAL HIGH (ref 0–44)
AST: 65 U/L — ABNORMAL HIGH (ref 15–41)
Albumin: 3.2 g/dL — ABNORMAL LOW (ref 3.5–5.0)
Alkaline Phosphatase: 40 U/L (ref 38–126)
Anion gap: 9 (ref 5–15)
BUN: 10 mg/dL (ref 6–20)
CO2: 23 mmol/L (ref 22–32)
Calcium: 8.3 mg/dL — ABNORMAL LOW (ref 8.9–10.3)
Chloride: 103 mmol/L (ref 98–111)
Creatinine, Ser: 0.93 mg/dL (ref 0.61–1.24)
GFR calc Af Amer: >60 mL/min (ref >60)
GFR calc non Af Amer: >60 mL/min (ref >60)
Glucose, Bld: 90 mg/dL (ref 70–99)
Potassium: 3.9 mmol/L (ref 3.5–5.1)
Sodium: 135 mmol/L (ref 135–145)
Total Bilirubin: 0.7 mg/dL (ref 0.3–1.2)
Total Protein: 7.4 g/dL (ref 6.5–8.1)

## 2018-12-18 LAB — CBC WITH DIFFERENTIAL/PLATELET
Abs Immature Granulocytes: 0.05 10*3/uL (ref 0.00–0.07)
Basophils Absolute: 0 10*3/uL (ref 0.0–0.1)
Basophils Relative: 0 %
Eosinophils Absolute: 0 10*3/uL (ref 0.0–0.5)
Eosinophils Relative: 0 %
HCT: 37.4 % — ABNORMAL LOW (ref 39.0–52.0)
Hemoglobin: 12.3 g/dL — ABNORMAL LOW (ref 13.0–17.0)
Immature Granulocytes: 1 %
Lymphocytes Relative: 24 %
Lymphs Abs: 1.6 10*3/uL (ref 0.7–4.0)
MCH: 30.7 pg (ref 26.0–34.0)
MCHC: 32.9 g/dL (ref 30.0–36.0)
MCV: 93.3 fL (ref 80.0–100.0)
Monocytes Absolute: 0.5 10*3/uL (ref 0.1–1.0)
Monocytes Relative: 7 %
Neutro Abs: 4.6 10*3/uL (ref 1.7–7.7)
Neutrophils Relative %: 68 %
Platelets: 254 10*3/uL (ref 150–400)
RBC: 4.01 MIL/uL — ABNORMAL LOW (ref 4.22–5.81)
RDW: 13 % (ref 11.5–15.5)
WBC: 6.7 10*3/uL (ref 4.0–10.5)
nRBC: 0 % (ref 0.0–0.2)

## 2018-12-18 LAB — C-REACTIVE PROTEIN: CRP: 18.8 mg/dL — ABNORMAL HIGH (ref <1.0)

## 2018-12-18 LAB — FIBRINOGEN: Fibrinogen: 751 mg/dL — ABNORMAL HIGH (ref 210–475)

## 2018-12-18 LAB — FERRITIN: Ferritin: 318 ng/mL (ref 24–336)

## 2018-12-18 LAB — LACTATE DEHYDROGENASE: LDH: 346 U/L — ABNORMAL HIGH (ref 98–192)

## 2018-12-18 LAB — BRAIN NATRIURETIC PEPTIDE: B Natriuretic Peptide: 33.8 pg/mL (ref 0.0–100.0)

## 2018-12-18 LAB — TRIGLYCERIDES: Triglycerides: 88 mg/dL (ref <150)

## 2018-12-18 LAB — D-DIMER, QUANTITATIVE: D-Dimer, Quant: 1.81 ug/mL-FEU — ABNORMAL HIGH (ref 0.00–0.50)

## 2018-12-18 LAB — LACTIC ACID, PLASMA: Lactic Acid, Venous: 1.2 mmol/L (ref 0.5–1.9)

## 2018-12-18 LAB — TROPONIN I: Troponin I: <0.03 ng/mL (ref <0.03)

## 2018-12-18 LAB — PROCALCITONIN: Procalcitonin: <0.1 ng/mL

## 2018-12-18 LAB — SARS CORONAVIRUS 2 BY RT PCR (HOSPITAL ORDER, PERFORMED IN ~~LOC~~ HOSPITAL LAB): SARS Coronavirus 2: POSITIVE — AB

## 2018-12-18 MED ORDER — ACETAMINOPHEN 325 MG PO TABS: 650.0000 mg | ORAL_TABLET | Freq: Four times a day (QID) | ORAL | Status: DC | PRN

## 2018-12-18 MED ORDER — GUAIFENESIN-DM 100-10 MG/5ML PO SYRP
10.0000 mL | ORAL_SOLUTION | ORAL | Status: DC | PRN
  Administered 2018-12-19: 10 mL via ORAL
  Filled 2018-12-18: qty 10

## 2018-12-18 MED ORDER — ONDANSETRON HCL 4 MG/2ML IJ SOLN: 4.0000 mg | Freq: Four times a day (QID) | INTRAMUSCULAR | Status: DC | PRN

## 2018-12-18 MED ORDER — SODIUM CHLORIDE 0.9 % IV SOLN: 250.0000 mL | INTRAVENOUS | Status: DC | PRN

## 2018-12-18 MED ORDER — HYDROCODONE-ACETAMINOPHEN 5-325 MG PO TABS: 1.0000 | ORAL_TABLET | ORAL | Status: DC | PRN

## 2018-12-18 MED ORDER — SODIUM CHLORIDE 0.9% FLUSH
3.0000 mL | Freq: Two times a day (BID) | INTRAVENOUS | Status: DC
  Administered 2018-12-19 – 2018-12-20 (×3): 3 mL via INTRAVENOUS

## 2018-12-18 MED ORDER — SODIUM CHLORIDE 0.9% FLUSH
3.0000 mL | Freq: Two times a day (BID) | INTRAVENOUS | Status: DC
  Administered 2018-12-18 – 2018-12-19 (×2): 3 mL via INTRAVENOUS

## 2018-12-18 MED ORDER — ZINC SULFATE 220 (50 ZN) MG PO CAPS
220.0000 mg | ORAL_CAPSULE | Freq: Every day | ORAL | Status: DC
  Administered 2018-12-18 – 2018-12-20 (×3): 220 mg via ORAL
  Filled 2018-12-18 (×4): qty 1

## 2018-12-18 MED ORDER — ENOXAPARIN SODIUM 60 MG/0.6ML ~~LOC~~ SOLN
50.0000 mg | SUBCUTANEOUS | Status: DC
  Administered 2018-12-18 – 2018-12-19 (×2): 50 mg via SUBCUTANEOUS
  Filled 2018-12-18 (×5): qty 0.5

## 2018-12-18 MED ORDER — SODIUM CHLORIDE 0.9% FLUSH: 3.0000 mL | INTRAVENOUS | Status: DC | PRN

## 2018-12-18 MED ORDER — VITAMIN C 500 MG PO TABS
500.0000 mg | ORAL_TABLET | Freq: Every day | ORAL | Status: DC
  Administered 2018-12-18 – 2018-12-20 (×3): 500 mg via ORAL
  Filled 2018-12-18 (×4): qty 1

## 2018-12-18 MED ORDER — ONDANSETRON HCL 4 MG PO TABS: 4.0000 mg | ORAL_TABLET | Freq: Four times a day (QID) | ORAL | Status: DC | PRN

## 2018-12-18 NOTE — ED Triage Notes (Signed)
Pt c/o headache, body aches, weakness, occasional cough x 10 days. Sent from [facility] for Covid rule out.

## 2018-12-18 NOTE — ED Triage Notes (Signed)
Pt c/o fever, HA, some coughing, body aches, chest hurts, and fatigue x 10 days.

## 2018-12-18 NOTE — ED Notes (Signed)
attempted report, left call back number, notified secretary that the patient is en route via [facility]

## 2018-12-18 NOTE — Progress Notes (Signed)
[facility] notified patient  arrived to [facility].

## 2018-12-18 NOTE — ED Provider Notes (Signed)
[facility]    CSN: [id number] Arrival date & time: 12/18/18  1304     History   Chief Complaint Chief Complaint  Patient presents with  . Cough  . Fever    HPI Travis Greene is a 53 y.o. male.   Patient developed mild respiratory symptoms about 10 days ago, having had an e-visit with Dr. Larose Kells 6 days ago.  Since then he has had a persistent mild sore throat, developing increased fatigue, daily fever with chills/sweats, myalgias, and severe headache.  He denies nasal congestion but has lost his sense of smell/taste.  During the past several days he has had increasing tightness in his anterior chest and shortness of breath, although he only has a mild cough.  He states that this is the sickest he has ever felt.  The history is provided by the patient. The history is limited by a language barrier. A language interpreter was used.    Past Medical History:  Diagnosis Date  . Chest pain    negative stress test 11/2009  . Colitis   . ED (erectile dysfunction) 11/09  . Hyperthyroidism 12/30/2011  . Ulcerative colitis Dx 2005   [facility], last Cscope 2010 Dr Oletta Lamas, was Rx lialda  . Vision abnormalities     Patient Active Problem List   Diagnosis Date Noted  . PCP NOTES >>>>>>>>>>>> 11/27/2015  . Eczema 10/17/2014  . Cauda equina syndrome with neurogenic bladder ([place]) 12/30/2011  . Annual physical exam 12/29/2010  . Ulcerative colitis ([place]) 01/02/2010  . ANXIETY 06/24/2008  . ERECTILE DYSFUNCTION 06/24/2008    Past Surgical History:  Procedure Laterality Date  . APPENDECTOMY    . VASECTOMY         Home Medications    Prior to Admission medications   Medication Sig Start Date End Date Taking? Authorizing Provider  gabapentin (NEURONTIN) 300 MG capsule Take 1 capsule (300 mg total) by mouth 2 (two) times daily. 10/17/18   Kathrynn Ducking, MD  mesalamine (LIALDA) 1.2 g EC tablet Take 4 tablets (4.8 g total) by mouth daily with breakfast. Patient not  taking: Reported on 06/23/2018 05/05/18   Colon Branch, MD    Family History Family History  Problem Relation Age of Onset  . Diabetes Mother   . Hypertension Mother        ?  . Coronary artery disease Neg Hx   . Prostate cancer Neg Hx   . Colon cancer Neg Hx     Social History Social History   Tobacco Use  . Smoking status: Former Smoker    Packs/day: 1.00    Years: 2.00    Pack years: 2.00    Types: Cigarettes    Last attempt to quit: 11/06/1997    Years since quitting: 21.1  . Smokeless tobacco: Never Used  Substance Use Topics  . Alcohol use: Yes    Comment: socially  . Drug use: No    Comment: multiple drugs before, now rarely uses marijuana     Allergies   Patient has no known allergies.   Review of Systems Review of Systems + sore throat, mild + cough, mild No pleuritic pain, but feels tight in anterior chest No wheezing No nasal congestion No post-nasal drainage No sinus pain/pressure No itchy/red eyes No earache No hemoptysis + SOB + fever, + chills + nausea No vomiting No abdominal pain No diarrhea No urinary symptoms No skin rash + fatigue + myalgias + headache Used OTC meds without  relief   Physical Exam Triage Vital Signs ED Triage Vitals  Enc Vitals Group     BP 12/18/18 1326 126/80     Pulse Rate 12/18/18 1326 (!) 105     Resp 12/18/18 1326 18     Temp 12/18/18 1326 (!) 100.9 F (38.3 C)     Temp Source 12/18/18 1326 Oral     SpO2 12/18/18 1326 95 %     Weight 12/18/18 1327 239 lb (108.4 kg)     Height --      Head Circumference --      Peak Flow --      Pain Score 12/18/18 1327 0     Pain Loc --      Pain Edu? --      Excl. in [place]? --    No data found.  Updated Vital Signs BP 126/80 (BP Location: Right Arm)   Pulse (!) 105   Temp (!) 100.9 F (38.3 C) (Oral)   Resp 18   Wt 108.4 kg   SpO2 95%   BMI 36.34 kg/m   Visual Acuity Right Eye Distance:   Left Eye Distance:   Bilateral Distance:    Right Eye Near:    Left Eye Near:    Bilateral Near:     Physical Exam Nursing notes and Vital Signs reviewed. Appearance:  Patient appears stated age, and in no acute distress Eyes:  Pupils are equal, round, and reactive to light and accomodation.  Extraocular movement is intact.  Conjunctivae are not inflamed  Ears:  Canals normal.  Tympanic membranes normal.  Nose:   No sinus tenderness.   Pharynx:  Normal; moist mucous membranes  Neck:  Supple. No adenopathy. Lungs:  Clear to auscultation.  Breath sounds are equal.   Heart:  Regular rate and rhythm without murmurs, rubs, or gallops. Rate 105. Abdomen:  Nontender without masses or hepatosplenomegaly.  Bowel sounds are present.  No CVA or flank tenderness.  Extremities:  No edema.  Skin:  No rash present.    UC Treatments / Results  Labs (all labs ordered are listed, but only abnormal results are displayed) Labs Reviewed - No data to display  EKG None  Radiology No results found.  Procedures Procedures (including critical care time)  Medications Ordered in UC Medications - No data to display  Initial Impression / Assessment and Plan / UC Course  I have reviewed the triage vital signs and the nursing notes.  Pertinent labs & imaging results that were available during my care of the patient were reviewed by me and considered in my medical decision making (see chart for details).    Patient advised to proceed to [facility] emergency department for further evaluation (daughter to drive).  Patient stable to travel by private vehicle.   Final Clinical Impressions(s) / UC Diagnoses   Final diagnoses:  Suspected Covid-19 Virus Infection  Dyspnea, unspecified type   Discharge Instructions   None    ED Prescriptions    None        Kandra Nicolas, MD 12/18/18 1414

## 2018-12-18 NOTE — ED Provider Notes (Signed)
[facility] DEPT Provider Note   CSN: [id number] Arrival date & time: 12/18/18  1454    History   Chief Complaint Chief Complaint  Patient presents with  . Headache  . Generalized Body Aches  . Weakness    HPI Travis Greene is a 53 y.o. male.     HPI Patient is referred to the emergency department from family medicine for concern of coronavirus.  Patient was seen today and referred to the ED.  Patient reports he has been sick for over a week.  He reports he has had cough and achiness and a headache.  Reports he has had a fever and shortness of breath.  Symptoms started to get worse over the past several days.  Patient denies he has other medical history.  He reports otherwise he is pretty healthy. Past Medical History:  Diagnosis Date  . Chest pain    negative stress test 11/2009  . Colitis   . ED (erectile dysfunction) 11/09  . Hyperthyroidism 12/30/2011  . Ulcerative colitis Dx 2005   [facility], last Cscope 2010 Dr Oletta Lamas, was Rx lialda  . Vision abnormalities     Patient Active Problem List   Diagnosis Date Noted  . PCP NOTES >>>>>>>>>>>> 11/27/2015  . Eczema 10/17/2014  . Cauda equina syndrome with neurogenic bladder ([place]) 12/30/2011  . Annual physical exam 12/29/2010  . Ulcerative colitis ([place]) 01/02/2010  . ANXIETY 06/24/2008  . ERECTILE DYSFUNCTION 06/24/2008    Past Surgical History:  Procedure Laterality Date  . APPENDECTOMY    . VASECTOMY          Home Medications    Prior to Admission medications   Medication Sig Start Date End Date Taking? Authorizing Provider  gabapentin (NEURONTIN) 300 MG capsule Take 1 capsule (300 mg total) by mouth 2 (two) times daily. 10/17/18   Kathrynn Ducking, MD  mesalamine (LIALDA) 1.2 g EC tablet Take 4 tablets (4.8 g total) by mouth daily with breakfast. Patient not taking: Reported on 06/23/2018 05/05/18   Colon Branch, MD    Family History Family History  Problem Relation Age of  Onset  . Diabetes Mother   . Hypertension Mother        ?  . Coronary artery disease Neg Hx   . Prostate cancer Neg Hx   . Colon cancer Neg Hx     Social History Social History   Tobacco Use  . Smoking status: Former Smoker    Packs/day: 1.00    Years: 2.00    Pack years: 2.00    Types: Cigarettes    Last attempt to quit: 11/06/1997    Years since quitting: 21.1  . Smokeless tobacco: Never Used  Substance Use Topics  . Alcohol use: Yes    Comment: socially  . Drug use: No    Comment: multiple drugs before, now rarely uses marijuana     Allergies   Patient has no known allergies.   Review of Systems Review of Systems 10 Systems reviewed and are negative for acute change except as noted in the HPI.  Physical Exam Updated Vital Signs BP 113/84   Pulse 94   Temp 99.7 F (37.6 C)   Resp (!) 25   Ht 5' 6"  (1.676 m)   Wt 108.9 kg   SpO2 93%   BMI 38.74 kg/m   Physical Exam Constitutional:      Comments: Patient interviewed on the telephone and I have observed him directly in the  room as we reviewed his history of present illness.  Patient is alert.  His mental status is clear.  He is talking on the telephone appropriately.  He is tachypneic but he is speaking in full clear sentences.  Cardiovascular:     Rate and Rhythm: Regular rhythm.     Comments: Monitor shows sinus rhythm in the 80s narrow complex. Pulmonary:     Comments: Patient has mild tachypnea.  He is not showing signs of impending respiratory failure.  His speech is clear in full sentences.  He is not having any active coughing at this time. Musculoskeletal: Normal range of motion.     Comments: Patient is moving all extremities appropriately without difficulty.  He can reach for the phone he does not have any peripheral edema and is moving his lower extremities appropriately.  He is physically well developed  Neurological:     Mental Status: He is oriented to person, place, and time.     Coordination:  Coordination normal.  Psychiatric:        Mood and Affect: Mood normal.      ED Treatments / Results  Labs (all labs ordered are listed, but only abnormal results are displayed) Labs Reviewed  CBC WITH DIFFERENTIAL/PLATELET - Abnormal; Notable for the following components:      Result Value   RBC 4.01 (*)    Hemoglobin 12.3 (*)    HCT 37.4 (*)    All other components within normal limits  COMPREHENSIVE METABOLIC PANEL - Abnormal; Notable for the following components:   Calcium 8.3 (*)    Albumin 3.2 (*)    AST 65 (*)    ALT 62 (*)    All other components within normal limits  D-DIMER, QUANTITATIVE (NOT AT [facility]) - Abnormal; Notable for the following components:   D-Dimer, Quant 1.81 (*)    All other components within normal limits  LACTATE DEHYDROGENASE - Abnormal; Notable for the following components:   LDH 346 (*)    All other components within normal limits  FIBRINOGEN - Abnormal; Notable for the following components:   Fibrinogen 751 (*)    All other components within normal limits  C-REACTIVE PROTEIN - Abnormal; Notable for the following components:   CRP 18.8 (*)    All other components within normal limits  CULTURE, BLOOD (ROUTINE X 2)  CULTURE, BLOOD (ROUTINE X 2)  SARS CORONAVIRUS 2 (HOSPITAL ORDER, [facility])  LACTIC ACID, PLASMA  PROCALCITONIN  FERRITIN  TRIGLYCERIDES  BRAIN NATRIURETIC PEPTIDE  TROPONIN I    EKG EKG Interpretation  Date/Time:  Monday Dec 18 2018 16:46:19 EDT Ventricular Rate:  95 PR Interval:    QRS Duration: 90 QT Interval:  334 QTC Calculation: 420 R Axis:   -21 Text Interpretation:  Sinus rhythm Borderline left axis deviation Low voltage, precordial leads agree, grossly normal, no old comparison Confirmed by Charlesetta Shanks [phone]) on 12/18/2018 6:18:05 PM   Radiology Dg Chest Port 1 View  Result Date: 12/18/2018 CLINICAL DATA:  Fever, chills and fatigue. EXAM: PORTABLE CHEST 1 VIEW COMPARISON:   Radiographs 09/20/2017. FINDINGS: 1519 hours. The heart size and mediastinal contours are stable. There are patchy perihilar and lower lobe airspace opacities, left greater than right. No pneumothorax or significant pleural effusion identified. The bones appear unremarkable. IMPRESSION: Multifocal airspace opacities in both lungs suspicious for multilobar pneumonia. Followup PA and lateral chest X-ray is recommended in 3-4 weeks following trial of antibiotic therapy to ensure resolution and exclude  underlying malignancy. Electronically Signed   By: Richardean Sale M.D.   On: 12/18/2018 15:54    Procedures Procedures (including critical care time)  Medications Ordered in ED Medications - No data to display   Initial Impression / Assessment and Plan / ED Course  I have reviewed the triage vital signs and the nursing notes.  Pertinent labs & imaging results that were available during my care of the patient were reviewed by me and considered in my medical decision making (see chart for details).       Patient presents with history of present illness and chest x-ray consistent with coronavirus.  Patient's mental status is clear.  He has mild to moderate increased work of breathing with tachypnea but does not have appearance of impending respiratory failure.  Chest x-ray shows patchy bilateral pneumonia.  Plan for admission for worsening coronavirus infection with now chest x-ray confirmed pneumonia and tachypnea.  Final Clinical Impressions(s) / ED Diagnoses   Final diagnoses:  Pneumonia due to 2019 novel coronavirus    ED Discharge Orders    None       Charlesetta Shanks, MD 12/18/18 1820

## 2018-12-18 NOTE — H&P (Addendum)
Travis Greene GYK:[id number] DOB: 09-29-1965 DOA: 12/18/2018     PCP: Colon Branch, MD   Outpatient Specialists:  NONE    Patient arrived to ER on 12/18/18 at 1454  Patient coming from: home Lives   With family    Chief Complaint:  Chief Complaint  Patient presents with   Headache   Generalized Body Aches   Weakness    HPI: Travis Greene is a 53 y.o. male with medical history significant of Ulcerative colitis, hypothyroidism    Presented with overall 12 days of viral-like illness Fevers and cough increased fatigue myalgias and severe headache loss of sense of smell and taste Patient was seen by his primary care provider on 12 May for possible COVID-19 that time recommended for him to have plenty of rest and fluids Tylenol as needed Been having worsening chest tightness and chest pain or shortness of breath only mild cough he has been very sick and sick as he ever felt. 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   Infectious risk factors:  Reports  fever, shortness of breath, dry cough, chest pain,   anosmia/change in taste,   Body aches, severe fatigue   In RAPID COVID TEST   POSITIVE  Regarding pertinent Chronic problems:  Ulcerative colitis only taking mesalamine not on Humira     Hypothyroidism:  Lab Results  Component Value Date   TSH 0.65 10/21/2017   not on synthroid   obesity-   BMI Readings from Last 1 Encounters:  12/18/18 38.74 kg/m    While in ER: To be hypoxic while talking down to 90% otherwise satting 93 to 95% on room air started on 2 L  The following Work up has been ordered so far:  Orders Placed This Encounter  Procedures   Blood Culture (routine x 2)   SARS Coronavirus 2 [facility] order, Performed in [facility] hospital  lab)   Culture, blood (Routine X 2) w Reflex to ID Panel   Culture, sputum-assessment   DG Chest Port 1 View   CBC WITH DIFFERENTIAL   Comprehensive metabolic panel   D-dimer, quantitative   Procalcitonin   Lactate dehydrogenase   Ferritin   Triglycerides   Fibrinogen   C-reactive protein   Brain natriuretic peptide   Troponin I - Once   CBC with Differential/Platelet   Comprehensive metabolic panel   C-reactive protein   CK   D-dimer, quantitative (not at [facility])   Interleukin-6, Plasma   Magnesium   Strep pneumoniae urinary antigen   Ferritin   Phosphorus   Diet Heart Room service appropriate? Yes; Fluid consistency: Thin   Cardiac monitoring   Initiate Carrier Fluid Protocol   Place surgical mask on patient   Patient to wear surgical mask during transportation   Notify EDP if new oxygen requirements escalates > 4L per minute [place]   RN to draw the following extra tubes:   Place on droplet/contact if:   Place on airborne/contact if:   RN/NT - Document specific oxygen requirements in [facility]   Cardiac monitoring   Patient has an active order for admit to inpatient/place in observation   Cardiac Monitoring - Continuous Indefinite   Maintain IV access   Vital signs   Notify physician   Up  with assistance   Novel Coronavirus PPE supplies (droplet and contact precautions) yellow stethoscopes, surgical mask, gowns, surgical caps, face shield, goggles, CAPR - on the floor/unit, cleaning Sani-Cloth (orange and purple top)   Place COVID-19 isolation sign and PPE checklist outside the DOOR   Do not give nonsteroidal anti-inflammatory drugs (NSAIDs)   Patient to wear surgical mask during transportation   Place working phone next to the patient   Assess patient for ability to self-prone. If able (can move self in bed, ambulate) and stable (SpO2 and oxygen requirement):   May go off telemetry for tests/procedures   Initiate Oral Care  Protocol   Initiate Carrier Fluid Protocol   RN may order General Admission PRN Orders utilizing "General Admission PRN medications" (through manage orders) for the following patient needs: allergy symptoms (Claritin), cold sores (Carmex), cough (Robitussin DM), eye irritation (Liquifilm Tears), hemorrhoids (Tucks), indigestion (Maalox), minor skin irritation (Hydrocortisone Cream), muscle pain Suezanne Jacquet Gay), nose irritation (saline nasal spray) and sore throat (Chloraseptic spray).   Full code   Consult to hospitalist   Droplet and Contact precautions   Pulse oximetry, continuous   Oxygen therapy Mode or (Route): Nasal cannula; Liters Per Minute: 2   Pulse oximetry check with vital signs   Flutter valve   Oxygen therapy Mode or (Route): Nasal cannula; Liters Per Minute: 2; Keep 02 saturation: greater than 92 %   ED EKG 12-Lead   EKG 12-Lead   EKG 12-Lead   ABO/Rh   Place in observation (patient's expected length of stay will be less than 2 midnights)     Following Medications were ordered in ER: Medications  sodium chloride flush (NS) 0.9 % injection 3 mL (has no administration in time range)  sodium chloride flush (NS) 0.9 % injection 3 mL (has no administration in time range)  sodium chloride flush (NS) 0.9 % injection 3 mL (has no administration in time range)  0.9 %  sodium chloride infusion (has no administration in time range)  guaiFENesin-dextromethorphan (ROBITUSSIN DM) 100-10 MG/5ML syrup 10 mL (has no administration in time range)  vitamin C (ASCORBIC ACID) tablet 500 mg (has no administration in time range)  zinc sulfate capsule 220 mg (has no administration in time range)  acetaminophen (TYLENOL) tablet 650 mg (has no administration in time range)  HYDROcodone-acetaminophen (NORCO/VICODIN) 5-325 MG per tablet 1-2 tablet (has no administration in time range)  ondansetron (ZOFRAN) tablet 4 mg (has no administration in time range)    Or  ondansetron (ZOFRAN)  injection 4 mg (has no administration in time range)  enoxaparin (LOVENOX) injection 40 mg (has no administration in time range)        Consult Orders  (From admission, onward)         Start     Ordered   12/18/18 1813  Consult to hospitalist  Once    Provider:  Toy Baker, MD  Question Answer Comment  Place call to: [facility]   Reason for Consult Admit      12/18/18 1812            Significant initial  Findings: Abnormal Labs Reviewed  SARS CORONAVIRUS 2 (HOSPITAL ORDER, [place] LAB) - Abnormal; Notable for the following components:      Result Value   SARS Coronavirus 2 POSITIVE (*)    All other components within normal limits  CBC WITH DIFFERENTIAL/PLATELET - Abnormal; Notable for the following components:   RBC 4.01 (*)    Hemoglobin 12.3 (*)  HCT 37.4 (*)    All other components within normal limits  COMPREHENSIVE METABOLIC PANEL - Abnormal; Notable for the following components:   Calcium 8.3 (*)    Albumin 3.2 (*)    AST 65 (*)    ALT 62 (*)    All other components within normal limits  D-DIMER, QUANTITATIVE (NOT AT [facility]) - Abnormal; Notable for the following components:   D-Dimer, Quant 1.81 (*)    All other components within normal limits  LACTATE DEHYDROGENASE - Abnormal; Notable for the following components:   LDH 346 (*)    All other components within normal limits  FIBRINOGEN - Abnormal; Notable for the following components:   Fibrinogen 751 (*)    All other components within normal limits  C-REACTIVE PROTEIN - Abnormal; Notable for the following components:   CRP 18.8 (*)    All other components within normal limits     Otherwise labs showing:    Recent Labs  Lab 12/18/18 1635  NA 135  K 3.9  CO2 23  GLUCOSE 90  BUN 10  CREATININE 0.93  CALCIUM 8.3*    Cr   stable,    Lab Results  Component Value Date   CREATININE 0.93 12/18/2018   CREATININE 0.93 03/03/2018   CREATININE 0.86  11/27/2015    Recent Labs  Lab 12/18/18 1635  AST 65*  ALT 62*  ALKPHOS 40  BILITOT 0.7  PROT 7.4  ALBUMIN 3.2*   Lab Results  Component Value Date   CALCIUM 8.3 (L) 12/18/2018    WBC      Component Value Date/Time   WBC 6.7 12/18/2018 1635   ANC    Component Value Date/Time   NEUTROABS 4.6 12/18/2018 1635   ALC 1.6   Plt: Lab Results  Component Value Date   PLT 254 12/18/2018    Lactic Acid, Venous    Component Value Date/Time   LATICACIDVEN 1.2 12/18/2018 1635    Procalcitonin <0.1      Component Value Date/Time   LDH 346 (H) 12/18/2018 1635      Lab Results  Component Value Date   CRP 18.8 (H) 12/18/2018   Lab Results  Component Value Date   DDIMER 1.81 (H) 12/18/2018     Lab Results  Component Value Date   FERRITIN 318 12/18/2018     HG/HCT stable,       Component Value Date/Time   HGB 12.3 (L) 12/18/2018 1635   HCT 37.4 (L) 12/18/2018 1635     Troponin   Cardiac Panel (last 3 results) Recent Labs    12/18/18 1635  TROPONINI <0.03     BNP (last 3 results) Recent Labs    12/18/18 1635  BNP 33.8      UA not ordered      CXR -multifocal airspace opacities both lungs suspicious for multilobar pneumonia    ECG:  Personally reviewed by me showing: HR : 95 Rhythm:  NSR,   no evidence of ischemic changes QTC 420    ED Triage Vitals  Enc Vitals Group     BP 12/18/18 1514 122/86     Pulse Rate 12/18/18 1514 95     Resp 12/18/18 1514 20     Temp 12/18/18 1514 99.7 F (37.6 C)     Temp src --      SpO2 12/18/18 1700 93 %     Weight 12/18/18 1515 240 lb (108.9 kg)     Height 12/18/18 1515 5' 6"  (1.676  m)     Head Circumference --      Peak Flow --      Pain Score 12/18/18 1515 0     Pain Loc --      Pain Edu? --      Excl. in [place]? --   TMAX(24)@       Latest  Blood pressure 113/84, pulse 94, temperature 99.7 F (37.6 C), resp. rate (!) 25, height 5' 6"  (1.676 m), weight 108.9 kg, SpO2 93 %.    Hospitalist  was called for admission for COVID19   Review of Systems:    Pertinent positives include: Fevers, chills, fatigue chest pain,   shortness of breath at rest.  dyspnea on exertion, Constitutional:  No weight loss, night sweats, , weight loss  HEENT:  No headaches, Difficulty swallowing,Tooth/dental problems,Sore throat,  No sneezing, itching, ear ache, nasal congestion, post nasal drip,  Cardio-vascular:  No Orthopnea, PND, anasarca, dizziness, palpitations.no Bilateral lower extremity swelling  GI:  No heartburn, indigestion, abdominal pain, nausea, vomiting, diarrhea, change in bowel habits, loss of appetite, melena, blood in stool, hematemesis Resp:  no No excess mucus, no productive cough, No non-productive cough, No coughing up of blood.No change in color of mucus.No wheezing. Skin:  no rash or lesions. No jaundice GU:  no dysuria, change in color of urine, no urgency or frequency. No straining to urinate.  No flank pain.  Musculoskeletal:  No joint pain or no joint swelling. No decreased range of motion. No back pain.  Psych:  No change in mood or affect. No depression or anxiety. No memory loss.  Neuro: no localizing neurological complaints, no tingling, no weakness, no double vision, no gait abnormality, no slurred speech, no confusion  All systems reviewed and apart from [place] all are negative  Past Medical History:   Past Medical History:  Diagnosis Date   Chest pain    negative stress test 11/2009   Colitis    ED (erectile dysfunction) 11/09   Hyperthyroidism 12/30/2011   Ulcerative colitis Dx 2005   [facility], last Cscope 2010 Dr Oletta Lamas, was Rx lialda   Vision abnormalities      Past Surgical History:  Procedure Laterality Date   APPENDECTOMY     VASECTOMY      Social History:  Ambulatory  independently     reports that he quit smoking about 21 years ago. His smoking use included cigarettes. He has a 2.00 pack-year smoking history. He has never  used smokeless tobacco. He reports current alcohol use. He reports that he does not use drugs.     Family History:   Family History  Problem Relation Age of Onset   Diabetes Mother    Hypertension Mother        ?   Coronary artery disease Neg Hx    Prostate cancer Neg Hx    Colon cancer Neg Hx     Allergies: No Known Allergies   Prior to Admission medications   Medication Sig Start Date End Date Taking? Authorizing Provider  gabapentin (NEURONTIN) 300 MG capsule Take 1 capsule (300 mg total) by mouth 2 (two) times daily. Patient not taking: Reported on 12/18/2018 10/17/18   Kathrynn Ducking, MD  mesalamine (LIALDA) 1.2 g EC tablet Take 4 tablets (4.8 g total) by mouth daily with breakfast. Patient not taking: Reported on 06/23/2018 05/05/18   Colon Branch, MD   Physical Exam: Blood pressure 113/84, pulse 94, temperature 99.7 F (37.6 C), resp. rate Marland Kitchen)  25, height 5' 6"  (1.676 m), weight 108.9 kg, SpO2 93 %. 1. General:  in   Acute distress increased work of breathing      Chronically ill -appearing 2. Psychological: Alert and  Oriented 3. Head/ENT:     Dry Mucous Membranes                          Head Non traumatic, neck supple                            Poor Dentition 4. SKIN:    decreased Skin turgor,  Skin clean Dry and intact no rash 5. Heart: Regular rate and rhythm no Murmur, no Rub or gallop 6. Lungs:  no wheezes or crackles   7. Abdomen: Soft, non-tender, Non distended  obese  bowel sounds present 8. Lower extremities: no clubbing, cyanosis, no  edema 9. Neurologically Grossly intact, moving all 4 extremities equally  10. MSK: Normal range of motion   All other LABS:     Recent Labs  Lab 12/18/18 1635  WBC 6.7  NEUTROABS 4.6  HGB 12.3*  HCT 37.4*  MCV 93.3  PLT 254     Recent Labs  Lab 12/18/18 1635  NA 135  K 3.9  CL 103  CO2 23  GLUCOSE 90  BUN 10  CREATININE 0.93  CALCIUM 8.3*     Recent Labs  Lab 12/18/18 1635  AST 65*  ALT 62*   ALKPHOS 40  BILITOT 0.7  PROT 7.4  ALBUMIN 3.2*       Cultures: No results found for: SDES, SPECREQUEST, CULT, REPTSTATUS   Radiological Exams on Admission: Dg Chest Port 1 View  Result Date: 12/18/2018 CLINICAL DATA:  Fever, chills and fatigue. EXAM: PORTABLE CHEST 1 VIEW COMPARISON:  Radiographs 09/20/2017. FINDINGS: 1519 hours. The heart size and mediastinal contours are stable. There are patchy perihilar and lower lobe airspace opacities, left greater than right. No pneumothorax or significant pleural effusion identified. The bones appear unremarkable. IMPRESSION: Multifocal airspace opacities in both lungs suspicious for multilobar pneumonia. Followup PA and lateral chest X-ray is recommended in 3-4 weeks following trial of antibiotic therapy to ensure resolution and exclude underlying malignancy. Electronically Signed   By: Richardean Sale M.D.   On: 12/18/2018 15:54    Chart has been reviewed    Assessment/Plan   male with medical history significant of Ulcerative colitis, hypothyroidism Admitted for multifocalfocal pneumonia secondary to COVID-19  Present on Admission:   Pneumonia due to COVID-19 virus -   Initial Rapid Novel Corona Virus testing:  Ordered 12/18/18 and is  positive    - As per hx pt with evidence of fever  Cough:  shortness of breath      Following concerning LAB/ imaging findings:       LFTs: increased AST/ALT/Tbili   CRP, LDH: increased   Ferritin increased   Procalcitonin: low    CXR: hazy bilateral peripheral opacities      -Following work-up initiated:        sputum cultures  Ordered 12/18/18, Blood cultures Ordered 12/18/18,      Following complications noted:     will be monitoring for evidence of other complications such as PE/DVT CVA or  cardiovascular events  Plan of treatment: - Transfer to [facility] facility if positive and beds are available    - Will follow daily d.dimer - Assess for ability to  prone  - Supportive  management -Fluid sparing resuscitation  -Provide oxygen as needed currently on 2L SpO2: 96 % - IF d.dimer elvated >5 will increase dose of lovenox   - Consult PCCM if becomes respiratory unstable   Poor Prognostic factors  53 y.o.  Personal hx of   Obesity       tachypnea, tachycardia present on admission    Risk for transmission of COVID 19  LOW   no evidence of severe cough,  oxygen requirement above 4 L, need for nebulizer, or other high risk of aerosolization   Will order Droplet and Contact precautions    Ulcerative colitis ([place]) chronic currently stable unsure if he is actually taking mesalamine currently Other plan as per orders.  DVT prophylaxis:    Lovenox     Code Status:  FULL CODE  as per patient  I had personally discussed CODE STATUS with patient a   Family Communication:   Family not at  Bedside  mother  Disposition Plan:       To home once workup is complete and patient is stable                      Consults called: none    Admission status:  ED Disposition    ED Disposition Condition [place]: [place] [100101]  Level of Care: Telemetry [5]  Covid Evaluation: Confirmed COVID Positive  Isolation Risk Level: Low Risk/Droplet (Less than 4L  supplementation)  Diagnosis: Pneumonia due to COVID-19 virus [4158309407]  Admitting Physician: Toy Baker [[id number]]  Attending Physician: Toy Baker [[id number]]  PT Class (Do Not Modify): Observation [104]  PT Acc Code (Do Not Modify): Observation [10022]         Obs    Level of care   tele  24H      Precautions:   Droplet and Contact precautions  PPE: Used by the provider:   P100  eye Goggles,  Gloves  gown      Andrick Rust Larned 12/18/2018, 9:57 PM    [facility]     after 2 AM please page floor coverage PA If 7AM-7PM, please contact the day team taking care of the patient using [email or web address]

## 2018-12-18 NOTE — ED Notes (Signed)
Bed: UY40 Expected date:  Expected time:  Means of arrival:  Comments: Negative Pressure

## 2018-12-19 DIAGNOSIS — K5100 Ulcerative (chronic) pancolitis without complications: Secondary | ICD-10-CM

## 2018-12-19 DIAGNOSIS — J1289 Other viral pneumonia: Secondary | ICD-10-CM | POA: Diagnosis not present

## 2018-12-19 DIAGNOSIS — R0603 Acute respiratory distress: Secondary | ICD-10-CM | POA: Diagnosis present

## 2018-12-19 DIAGNOSIS — F411 Generalized anxiety disorder: Secondary | ICD-10-CM

## 2018-12-19 DIAGNOSIS — E669 Obesity, unspecified: Secondary | ICD-10-CM | POA: Diagnosis present

## 2018-12-19 DIAGNOSIS — K0889 Other specified disorders of teeth and supporting structures: Secondary | ICD-10-CM | POA: Diagnosis present

## 2018-12-19 DIAGNOSIS — R509 Fever, unspecified: Secondary | ICD-10-CM | POA: Diagnosis present

## 2018-12-19 DIAGNOSIS — G834 Cauda equina syndrome: Secondary | ICD-10-CM | POA: Diagnosis not present

## 2018-12-19 DIAGNOSIS — K5190 Ulcerative colitis, unspecified, without complications: Secondary | ICD-10-CM | POA: Diagnosis present

## 2018-12-19 DIAGNOSIS — U071 COVID-19: Secondary | ICD-10-CM | POA: Diagnosis not present

## 2018-12-19 DIAGNOSIS — R6510 Systemic inflammatory response syndrome (SIRS) of non-infectious origin without acute organ dysfunction: Secondary | ICD-10-CM | POA: Diagnosis not present

## 2018-12-19 DIAGNOSIS — Z87891 Personal history of nicotine dependence: Secondary | ICD-10-CM | POA: Diagnosis not present

## 2018-12-19 DIAGNOSIS — Z79890 Hormone replacement therapy (postmenopausal): Secondary | ICD-10-CM | POA: Diagnosis not present

## 2018-12-19 DIAGNOSIS — Z6835 Body mass index (BMI) 35.0-35.9, adult: Secondary | ICD-10-CM | POA: Diagnosis not present

## 2018-12-19 DIAGNOSIS — R630 Anorexia: Secondary | ICD-10-CM | POA: Diagnosis present

## 2018-12-19 DIAGNOSIS — E039 Hypothyroidism, unspecified: Secondary | ICD-10-CM | POA: Diagnosis present

## 2018-12-19 DIAGNOSIS — Z79899 Other long term (current) drug therapy: Secondary | ICD-10-CM | POA: Diagnosis not present

## 2018-12-19 LAB — CBC WITH DIFFERENTIAL/PLATELET
Abs Immature Granulocytes: 0.04 10*3/uL (ref 0.00–0.07)
Basophils Absolute: 0 10*3/uL (ref 0.0–0.1)
Basophils Relative: 0 %
Eosinophils Absolute: 0 10*3/uL (ref 0.0–0.5)
Eosinophils Relative: 0 %
HCT: 35.2 % — ABNORMAL LOW (ref 39.0–52.0)
Hemoglobin: 11.8 g/dL — ABNORMAL LOW (ref 13.0–17.0)
Immature Granulocytes: 1 %
Lymphocytes Relative: 31 %
Lymphs Abs: 1.8 10*3/uL (ref 0.7–4.0)
MCH: 30.8 pg (ref 26.0–34.0)
MCHC: 33.5 g/dL (ref 30.0–36.0)
MCV: 91.9 fL (ref 80.0–100.0)
Monocytes Absolute: 0.5 10*3/uL (ref 0.1–1.0)
Monocytes Relative: 8 %
Neutro Abs: 3.6 10*3/uL (ref 1.7–7.7)
Neutrophils Relative %: 60 %
Platelets: 231 10*3/uL (ref 150–400)
RBC: 3.83 MIL/uL — ABNORMAL LOW (ref 4.22–5.81)
RDW: 13 % (ref 11.5–15.5)
WBC: 5.9 10*3/uL (ref 4.0–10.5)
nRBC: 0 % (ref 0.0–0.2)

## 2018-12-19 LAB — COMPREHENSIVE METABOLIC PANEL
ALT: 73 U/L — ABNORMAL HIGH (ref 0–44)
AST: 73 U/L — ABNORMAL HIGH (ref 15–41)
Albumin: 2.8 g/dL — ABNORMAL LOW (ref 3.5–5.0)
Alkaline Phosphatase: 35 U/L — ABNORMAL LOW (ref 38–126)
Anion gap: 11 (ref 5–15)
BUN: 12 mg/dL (ref 6–20)
CO2: 22 mmol/L (ref 22–32)
Calcium: 7.9 mg/dL — ABNORMAL LOW (ref 8.9–10.3)
Chloride: 103 mmol/L (ref 98–111)
Creatinine, Ser: 0.85 mg/dL (ref 0.61–1.24)
GFR calc Af Amer: >60 mL/min (ref >60)
GFR calc non Af Amer: >60 mL/min (ref >60)
Glucose, Bld: 103 mg/dL — ABNORMAL HIGH (ref 70–99)
Potassium: 3.7 mmol/L (ref 3.5–5.1)
Sodium: 136 mmol/L (ref 135–145)
Total Bilirubin: 0.4 mg/dL (ref 0.3–1.2)
Total Protein: 6.5 g/dL (ref 6.5–8.1)

## 2018-12-19 LAB — RESPIRATORY PANEL BY PCR

## 2018-12-19 LAB — PHOSPHORUS: Phosphorus: 3.4 mg/dL (ref 2.5–4.6)

## 2018-12-19 LAB — FERRITIN: Ferritin: 339 ng/mL — ABNORMAL HIGH (ref 24–336)

## 2018-12-19 LAB — BRAIN NATRIURETIC PEPTIDE: B Natriuretic Peptide: 21.4 pg/mL (ref 0.0–100.0)

## 2018-12-19 LAB — ABO/RH: ABO/RH(D): O POS

## 2018-12-19 LAB — D-DIMER, QUANTITATIVE: D-Dimer, Quant: 1.4 ug/mL-FEU — ABNORMAL HIGH (ref 0.00–0.50)

## 2018-12-19 LAB — TSH: TSH: 0.516 u[IU]/mL (ref 0.350–4.500)

## 2018-12-19 LAB — MAGNESIUM: Magnesium: 2 mg/dL (ref 1.7–2.4)

## 2018-12-19 LAB — C-REACTIVE PROTEIN: CRP: 17.5 mg/dL — ABNORMAL HIGH (ref <1.0)

## 2018-12-19 LAB — CK: Total CK: 62 U/L (ref 49–397)

## 2018-12-19 MED ORDER — LORAZEPAM 2 MG/ML IJ SOLN: 1.0000 mg | Freq: Three times a day (TID) | INTRAMUSCULAR | Status: DC | PRN

## 2018-12-19 MED ORDER — MESALAMINE 1.2 G PO TBEC
4.8000 g | DELAYED_RELEASE_TABLET | Freq: Every day | ORAL | Status: DC
  Administered 2018-12-20: 4.8 g via ORAL
  Filled 2018-12-19 (×2): qty 4

## 2018-12-19 MED ORDER — VITAMIN C 500 MG PO TABS: 500.0000 mg | ORAL_TABLET | Freq: Every day | ORAL | Status: DC

## 2018-12-19 MED ORDER — GABAPENTIN 300 MG PO CAPS
300.0000 mg | ORAL_CAPSULE | Freq: Two times a day (BID) | ORAL | Status: DC
  Administered 2018-12-19 – 2018-12-20 (×3): 300 mg via ORAL
  Filled 2018-12-19 (×3): qty 1

## 2018-12-19 MED ORDER — IPRATROPIUM-ALBUTEROL 20-100 MCG/ACT IN AERS
1.0000 | INHALATION_SPRAY | Freq: Three times a day (TID) | RESPIRATORY_TRACT | Status: DC
  Administered 2018-12-19 (×3): 1 via RESPIRATORY_TRACT

## 2018-12-19 MED ORDER — ZINC SULFATE 220 (50 ZN) MG PO CAPS: 220.0000 mg | ORAL_CAPSULE | Freq: Every day | ORAL | Status: DC

## 2018-12-19 MED ORDER — METHYLPREDNISOLONE SODIUM SUCC 125 MG IJ SOLR
40.0000 mg | Freq: Three times a day (TID) | INTRAMUSCULAR | Status: DC
  Administered 2018-12-19 – 2018-12-20 (×5): 40 mg via INTRAVENOUS
  Filled 2018-12-19 (×5): qty 2

## 2018-12-19 MED ORDER — IPRATROPIUM-ALBUTEROL 20-100 MCG/ACT IN AERS
1.0000 | INHALATION_SPRAY | Freq: Four times a day (QID) | RESPIRATORY_TRACT | Status: DC
  Administered 2018-12-19: 1 via RESPIRATORY_TRACT
  Filled 2018-12-19: qty 4

## 2018-12-19 NOTE — Progress Notes (Addendum)
Makoa Satz PQZ:[id number] DOB: 1965-12-02 DOA: 12/18/2018 PCP: Colon Branch, MD   Subj: 53 y.o. HM (Spanish speaker) PMHx , Anxiety ulcerative colitis, hypothyroidism, cauda equina syndrome with neurogenic bladder     Presented with overall 12 days of viral-like illness Fevers and cough increased fatigue myalgias and severe headache loss of sense of smell and taste Patient was seen by his primary care provider on 12 May for possible COVID-19 that time recommended for him to have plenty of rest and fluids Tylenol as needed Been having worsening chest tightness and chest pain or shortness of breath only mild cough he has been very sick and sick as he ever felt. 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   Infectious risk factors:  Reports  fever, shortness of breath, dry cough, chest pain,   anosmia/change in taste,   Body aches, severe fatigue   In RAPID COVID TEST   POSITIVE   Regarding pertinent Chronic problems:  Ulcerative colitis only taking mesalamine not on Humira    Obj: Objective: VITAL SIGNS: Temp: 98.6 F (37 C) (05/19 0008) Temp Source: Oral (05/19 0008) BP: 128/89 (05/19 0008) Pulse Rate: 86 (05/19 0008) SPO2; FIO2:  No intake or output data in the 24 hours ending 12/19/18 0301   Exam: General: A/O x4, positive acute respiratory distress (DOE), described as tightness in chest Lungs: Diffuse decreased breath sounds, without wheezes or crackles Cardiovascular: Regular rate and rhythm without murmur gallop or rub normal S1 and S2 Abdomen: Nontender, nondistended, soft, bowel sounds positive, no rebound, no ascites, no appreciable mass Extremities: No significant cyanosis, clubbing, or edema bilateral lower extremities Skin: Negative rashes, lesions,  ulcers Psychiatric:  Negative depression, negative anxiety, negative fatigue, negative mania  Central nervous system:  Cranial nerves II through XII intact, tongue/uvula midline, all extremities muscle strength 5/5, sensation intact throughout,  negative dysarthria, negative expressive aphasia, negative receptive aphasia.  .     Procedure/Significant Events: None  I have personally reviewed and interpreted all radiology studies and my findings are as above.   Culture 5/18 pending 5/18 sputum pending 5/18 SARS coronavirus positive     Antibiotics: Anti-infectives (From admission, onward)   None        Active Problems:   Anxiety state   Ulcerative colitis ([place])   Cauda equina syndrome with neurogenic bladder (HCC)   Pneumonia due to COVID-19 virus   A/P  Pneumonia due to COVID 19 virus -States he believes his wife is becoming ill also, will notify her in the morning to  come and be checked out - Solu-Medrol 40 mg TID - Combivent QID -Initial COVID labs elevated placing patient at increased risk for ARDS/MODS - Key daily COVID labs pending  Anxiety -Currently patient not complaining of any anxiety however spoke to Dr.Doutova and patient was anxious/agitated. - Ativan 1 mg TID PRN   Cauda equina syndrome with neurogenic bladder -Has not complained of urinary retention however will monitor closely -Strict in and out -Daily weight  Ulcerative colitis - At home on mesalamine which is an immunosuppressant will hold, while patient on steroids  Hypothyroidism? - Patient not on Synthroid at home. -TSH pending    Care during the described time interval was provided by me .  I have reviewed  this patient's available data, including medical history, events of note, physical examination, and all test results as part of my evaluation.

## 2018-12-19 NOTE — Progress Notes (Signed)
[facility] and [facility] Team  ReDS Research Project   Patient #: [id number]  ReDS Measurement  Right: 45 %  Left: 41 %

## 2018-12-19 NOTE — Progress Notes (Signed)
[facility] PROGRESS NOTE    Progress Note  Travis Greene  RCV:[id number] DOB: 1965/11/29 DOA: 12/18/2018 PCP: Colon Branch, MD     Brief Narrative:   Travis Greene is an 53 y.o. male past medical history-itis, hypothyroidism and cauda equina with a neurogenic bladder went to the ED because of fever, cough fatigue severe headache and anosmia, who went and saw his primary care doctor on 12/12/2018 for the previous symptoms and was diagnosed with a SARS-CoV-2  Assessment/Plan:   Pneumonia due to COVID-19 virus: He relates his symptoms started on 12/04/2018. Started on IV Solu-Medrol and inhalers. He has been satting greater 92% on room air. If his symptoms continues to improve and he remains off oxygen we will probably discharge him in the morning.  Anxiety state: Compliant with his medications, continue benzodiazepines as needed.  Ulcerative colitis ([place]): Resume mesalamine, he is currently on steroids for COVID-19.  Cauda equina syndrome with neurogenic bladder [facility]): Continue monitor strict I's and O's.  Hypothyroidism: Continue Synthroid TSH is pending.    DVT prophylaxis: Lovenox Family Communication:None Disposition Plan/Barrier to D/C: home in am Code Status:     Code Status Orders  (From admission, onward)         Start     Ordered   12/18/18 2150  Full code  Continuous     12/18/18 2149        Code Status History    This patient has a current code status but no historical code status.        IV Access:    Peripheral IV   Procedures and diagnostic studies:   Dg Chest Port 1 View  Result Date: 12/18/2018 CLINICAL DATA:  Fever, chills and fatigue. EXAM: PORTABLE CHEST 1 VIEW COMPARISON:  Radiographs 09/20/2017. FINDINGS: 1519 hours. The heart size and mediastinal contours are stable. There are patchy perihilar and lower lobe airspace opacities, left greater than right. No pneumothorax or significant pleural effusion identified. The bones  appear unremarkable. IMPRESSION: Multifocal airspace opacities in both lungs suspicious for multilobar pneumonia. Followup PA and lateral chest X-ray is recommended in 3-4 weeks following trial of antibiotic therapy to ensure resolution and exclude underlying malignancy. Electronically Signed   By: Richardean Sale M.D.   On: 12/18/2018 15:54     Medical Consultants:    None.  Anti-Infectives:   None Subjective:    Travis Greene relates his breathing is slightly better than yesterday, he still feels tired and anorexic.  Objective:    Vitals:   12/18/18 2205 12/19/18 0008 12/19/18 0300 12/19/18 0316  BP:  128/89 118/85   Pulse: 92 86 84   Resp: 20 (!) 29 (!) 28   Temp:  98.6 F (37 C) 98.5 F (36.9 C)   TempSrc:  Oral Oral   SpO2: 95% 96% 97%   Weight:    99.9 kg  Height:       No intake or output data in the 24 hours ending 12/19/18 0701 Filed Weights   12/18/18 1515 12/19/18 0316  Weight: 108.9 kg 99.9 kg    Exam: General exam: In no acute distress. Respiratory system: Good air movement and clear to auscultation. Cardiovascular system: S1 & S2 heard, RRR. No JVD.  Gastrointestinal system: Abdomen is nondistended, soft and nontender.  Central nervous system: Alert and oriented. No focal neurological deficits. Extremities: No pedal edema. Skin: No rashes, lesions or ulcers Psychiatry: Judgement and insight appear normal. Mood & affect appropriate.     Data Reviewed:  Labs: Basic Metabolic Panel: Recent Labs  Lab 12/18/18 1635 12/19/18 0425  NA 135 136  K 3.9 3.7  CL 103 103  CO2 23 22  GLUCOSE 90 103*  BUN 10 12  CREATININE 0.93 0.85  CALCIUM 8.3* 7.9*  MG  --  2.0  PHOS  --  3.4   GFR Estimated Creatinine Clearance: 108.6 mL/min (by C-G formula based on SCr of 0.85 mg/dL). Liver Function Tests: Recent Labs  Lab 12/18/18 1635 12/19/18 0425  AST 65* 73*  ALT 62* 73*  ALKPHOS 40 35*  BILITOT 0.7 0.4  PROT 7.4 6.5  ALBUMIN 3.2* 2.8*    No results for input(s): LIPASE, AMYLASE in the last 168 hours. No results for input(s): AMMONIA in the last 168 hours. Coagulation profile No results for input(s): INR, PROTIME in the last 168 hours. COVID-19 Labs  Recent Labs    12/18/18 1635 12/19/18 0425  DDIMER 1.81* 1.40*  FERRITIN 318 339*  LDH 346*  --   CRP 18.8* 17.5*    Lab Results  Component Value Date   SARSCOV2NAA POSITIVE (A) 12/18/2018    CBC: Recent Labs  Lab 12/18/18 1635 12/19/18 0425  WBC 6.7 5.9  NEUTROABS 4.6 3.6  HGB 12.3* 11.8*  HCT 37.4* 35.2*  MCV 93.3 91.9  PLT 254 231   Cardiac Enzymes: Recent Labs  Lab 12/18/18 1635 12/19/18 0425  CKTOTAL  --  59  TROPONINI <0.03  --    BNP (last 3 results) No results for input(s): PROBNP in the last 8760 hours. CBG: No results for input(s): GLUCAP in the last 168 hours. D-Dimer: Recent Labs    12/18/18 1635 12/19/18 0425  DDIMER 1.81* 1.40*   Hgb A1c: No results for input(s): HGBA1C in the last 72 hours. Lipid Profile: Recent Labs    12/18/18 1635  TRIG 88   Thyroid function studies: No results for input(s): TSH, T4TOTAL, T3FREE, THYROIDAB in the last 72 hours.  Invalid input(s): FREET3 Anemia work up: Recent Labs    12/18/18 1635 12/19/18 0425  FERRITIN 318 339*   Sepsis Labs: Recent Labs  Lab 12/18/18 1635 12/19/18 0425  PROCALCITON <0.10  --   WBC 6.7 5.9  LATICACIDVEN 1.2  --    Microbiology Recent Results (from the past 240 hour(s))  SARS Coronavirus 2 [facility] order, Performed in [place] hospital lab)     Status: Abnormal   Collection Time: 12/18/18  4:35 PM  Result Value Ref Range Status   SARS Coronavirus 2 POSITIVE (A) NEGATIVE Final    Comment: RESULT CALLED TO, READ BACK BY AND VERIFIED WITH: REYNOZA,M. RN @1845  ON 05.18.2020 BY COHEN,K (NOTE) If result is NEGATIVE SARS-CoV-2 target nucleic acids are NOT DETECTED. The SARS-CoV-2 RNA is generally detectable in upper and lower  respiratory  specimens during the acute phase of infection. The lowest  concentration of SARS-CoV-2 viral copies this assay can detect is 250  copies / mL. A negative result does not preclude SARS-CoV-2 infection  and should not be used as the sole basis for treatment or other  patient management decisions.  A negative result may occur with  improper specimen collection / handling, submission of specimen other  than nasopharyngeal swab, presence of viral mutation(s) within the  areas targeted by this assay, and inadequate number of viral copies  (<250 copies / mL). A negative result must be combined with clinical  observations, patient history, and epidemiological information. If result is POSITIVE SARS-CoV-2 target nucleic acids are DET ECTED.  The SARS-CoV-2 RNA is generally detectable in upper and lower  respiratory specimens during the acute phase of infection.  Positive  results are indicative of active infection with SARS-CoV-2.  Clinical  correlation with patient history and other diagnostic information is  necessary to determine patient infection status.  Positive results do  not rule out bacterial infection or co-infection with other viruses. If result is PRESUMPTIVE POSTIVE SARS-CoV-2 nucleic acids MAY BE PRESENT.   A presumptive positive result was obtained on the submitted specimen  and confirmed on repeat testing.  While 2019 novel coronavirus  (SARS-CoV-2) nucleic acids may be present in the submitted sample  additional confirmatory testing may be necessary for epidemiological  and / or clinical management purposes  to differentiate between  SARS-CoV-2 and other Sarbecovirus currently known to infect humans.  If clinically indicated additional testing with an alternate test  methodology (Phil Campbell) is advised. The SARS-CoV-2 RNA is generally  detectable in upper and lower respiratory specimens during the acute  phase of infection. The expected result is Negative. Fact Sheet for  Patients:  StrictlyIdeas.no Fact Sheet for Healthcare Providers: BankingDealers.co.za This test is not yet approved or cleared by the [place] FDA and has been authorized for detection and/or diagnosis of SARS-CoV-2 by FDA under an Emergency Use Authorization (EUA).  This EUA will remain in effect (meaning this test can be used) for the duration of the COVID-19 declaration under Section 564(b)(1) of the Act, 21 U.S.C. section 360bbb-3(b)(1), unless the authorization is terminated or revoked sooner. Performed at [facility], [place] [address], [place], [place] [address]      Medications:   . enoxaparin (LOVENOX) injection  50 mg Subcutaneous Q24H  . Ipratropium-Albuterol  1 puff Inhalation Q6H  . methylPREDNISolone (SOLU-MEDROL) injection  40 mg Intravenous Q8H  . sodium chloride flush  3 mL Intravenous Q12H  . sodium chloride flush  3 mL Intravenous Q12H  . vitamin C  500 mg Oral Daily  . zinc sulfate  220 mg Oral Daily   Continuous Infusions: . sodium chloride        LOS: 0 days   Charlynne Cousins  [facility]  12/19/2018, 7:01 AM

## 2018-12-19 NOTE — Progress Notes (Signed)
SATURATION QUALIFICATIONS: (This note is used to comply with regulatory documentation for home oxygen)  Patient Saturations on Room Air at Rest = 90%  Patient Saturations on Room Air while Ambulating = 91-96%

## 2018-12-20 DIAGNOSIS — R6510 Systemic inflammatory response syndrome (SIRS) of non-infectious origin without acute organ dysfunction: Secondary | ICD-10-CM

## 2018-12-20 LAB — COMPREHENSIVE METABOLIC PANEL
ALT: 93 U/L — ABNORMAL HIGH (ref 0–44)
AST: 61 U/L — ABNORMAL HIGH (ref 15–41)
Albumin: 2.9 g/dL — ABNORMAL LOW (ref 3.5–5.0)
Alkaline Phosphatase: 42 U/L (ref 38–126)
Anion gap: 8 (ref 5–15)
BUN: 20 mg/dL (ref 6–20)
CO2: 23 mmol/L (ref 22–32)
Calcium: 8.6 mg/dL — ABNORMAL LOW (ref 8.9–10.3)
Chloride: 103 mmol/L (ref 98–111)
Creatinine, Ser: 0.86 mg/dL (ref 0.61–1.24)
GFR calc Af Amer: >60 mL/min (ref >60)
GFR calc non Af Amer: >60 mL/min (ref >60)
Glucose, Bld: 207 mg/dL — ABNORMAL HIGH (ref 70–99)
Potassium: 4 mmol/L (ref 3.5–5.1)
Sodium: 134 mmol/L — ABNORMAL LOW (ref 135–145)
Total Bilirubin: 0.5 mg/dL (ref 0.3–1.2)
Total Protein: 6.9 g/dL (ref 6.5–8.1)

## 2018-12-20 LAB — CBC WITH DIFFERENTIAL/PLATELET
Abs Immature Granulocytes: 0.13 10*3/uL — ABNORMAL HIGH (ref 0.00–0.07)
Basophils Absolute: 0 10*3/uL (ref 0.0–0.1)
Basophils Relative: 0 %
Eosinophils Absolute: 0 10*3/uL (ref 0.0–0.5)
Eosinophils Relative: 0 %
HCT: 36.6 % — ABNORMAL LOW (ref 39.0–52.0)
Hemoglobin: 12.3 g/dL — ABNORMAL LOW (ref 13.0–17.0)
Immature Granulocytes: 1 %
Lymphocytes Relative: 20 %
Lymphs Abs: 1.8 10*3/uL (ref 0.7–4.0)
MCH: 30.8 pg (ref 26.0–34.0)
MCHC: 33.6 g/dL (ref 30.0–36.0)
MCV: 91.7 fL (ref 80.0–100.0)
Monocytes Absolute: 0.9 10*3/uL (ref 0.1–1.0)
Monocytes Relative: 10 %
Neutro Abs: 6.2 10*3/uL (ref 1.7–7.7)
Neutrophils Relative %: 69 %
Platelets: 279 10*3/uL (ref 150–400)
RBC: 3.99 MIL/uL — ABNORMAL LOW (ref 4.22–5.81)
RDW: 12.6 % (ref 11.5–15.5)
WBC: 9.1 10*3/uL (ref 4.0–10.5)
nRBC: 0 % (ref 0.0–0.2)

## 2018-12-20 LAB — PHOSPHORUS: Phosphorus: 3.8 mg/dL (ref 2.5–4.6)

## 2018-12-20 LAB — CK: Total CK: 68 U/L (ref 49–397)

## 2018-12-20 LAB — C-REACTIVE PROTEIN
CRP: 9.7 mg/dL — ABNORMAL HIGH (ref <1.0)
CRP: 9.7 mg/dL — ABNORMAL HIGH (ref <1.0)

## 2018-12-20 LAB — D-DIMER, QUANTITATIVE: D-Dimer, Quant: 1.36 ug/mL-FEU — ABNORMAL HIGH (ref 0.00–0.50)

## 2018-12-20 LAB — FERRITIN
Ferritin: 342 ng/mL — ABNORMAL HIGH (ref 24–336)
Ferritin: 357 ng/mL — ABNORMAL HIGH (ref 24–336)

## 2018-12-20 LAB — MAGNESIUM: Magnesium: 2.1 mg/dL (ref 1.7–2.4)

## 2018-12-20 LAB — INTERLEUKIN-6, PLASMA: Interleukin-6, Plasma: 30.3 pg/mL — ABNORMAL HIGH (ref 0.0–12.2)

## 2018-12-20 MED ORDER — IPRATROPIUM-ALBUTEROL 20-100 MCG/ACT IN AERS: 1.0000 | INHALATION_SPRAY | Freq: Four times a day (QID) | RESPIRATORY_TRACT | Status: DC | PRN

## 2018-12-20 NOTE — Progress Notes (Signed)
[facility] and AHF Team  ReDS Research Project   Patient #: [id number]  ReDS Measurement  Right: 39%  Left: 40%

## 2018-12-20 NOTE — Discharge Summary (Signed)
Physician Discharge Summary  Travis Greene EQA:[id number] DOB: 08-21-1965 DOA: 12/18/2018  PCP: Colon Branch, MD  Admit date: 12/18/2018 Discharge date: 12/20/2018  Admitted From: Home Disposition:  Home  Recommendations for Outpatient Follow-up:  1. Follow up with PCP in 1-2 weeks 2. Please obtain BMP/CBC in one week   Home Health:No Equipment/Devices:None  Discharge Condition:Stable CODE STATUS:Full Diet recommendation: Heart Healthy   Brief/Interim Summary: 53 y.o. male past medical history-itis, hypothyroidism and cauda equina with a neurogenic bladder went to the ED because of fever, cough fatigue severe headache and anosmia, who went and saw his primary care doctor on 12/12/2018 for the previous symptoms and was diagnosed with a SARS-CoV-2  Discharge Diagnoses:  Active Problems:   Anxiety state   Ulcerative colitis ([place])   Cauda equina syndrome with neurogenic bladder (HCC)   Pneumonia due to COVID-19 virus   SIRS (systemic inflammatory response syndrome) ([place]) Sirs due to COVID-19 pneumonia: His symptoms started on 12/04/2018.  His saturations remained stable throughout his hospital stay, he was ambulated and his saturations remained greater than 90%. His symptoms improved he was weaned off oxygen he will be discharged home.  Anxiety state: No changes made to his medication.  Ulcerative colitis: No changes made to his medication.  Cauda equina with neurogenic bladder: Monitor strict I's and O's.  Hypothyroidism: Continue Synthroid.   Discharge Instructions  Discharge Instructions    Diet - low sodium heart healthy   Complete by:  As directed    Increase activity slowly   Complete by:  As directed      Allergies as of 12/20/2018   No Known Allergies     Medication List    TAKE these medications   gabapentin 300 MG capsule Commonly known as:  NEURONTIN Take 1 capsule (300 mg total) by mouth 2 (two) times daily.   mesalamine 1.2 g EC tablet Commonly  known as:  LIALDA Take 4 tablets (4.8 g total) by mouth daily with breakfast.       No Known Allergies  Consultations:  none   Procedures/Studies: Dg Chest Port 1 View  Result Date: 12/18/2018 CLINICAL DATA:  Fever, chills and fatigue. EXAM: PORTABLE CHEST 1 VIEW COMPARISON:  Radiographs 09/20/2017. FINDINGS: 1519 hours. The heart size and mediastinal contours are stable. There are patchy perihilar and lower lobe airspace opacities, left greater than right. No pneumothorax or significant pleural effusion identified. The bones appear unremarkable. IMPRESSION: Multifocal airspace opacities in both lungs suspicious for multilobar pneumonia. Followup PA and lateral chest X-ray is recommended in 3-4 weeks following trial of antibiotic therapy to ensure resolution and exclude underlying malignancy. Electronically Signed   By: Richardean Sale M.D.   On: 12/18/2018 15:54    Subjective: No complains, feels great.  Discharge Exam: Vitals:   12/20/18 0018 12/20/18 0440  BP:  97/69  Pulse:  85  Resp:  (!) 22  Temp: 98.2 F (36.8 C) 98.5 F (36.9 C)  SpO2:  93%     General: Pt is alert, awake, not in acute distress Cardiovascular: RRR, S1/S2 +, no rubs, no gallops Respiratory: CTA bilaterally, no wheezing, no rhonchi Abdominal: Soft, NT, ND, bowel sounds + Extremities: no edema, no cyanosis    The results of significant diagnostics from this hospitalization (including imaging, microbiology, ancillary and laboratory) are listed below for reference.     Microbiology: Recent Results (from the past 240 hour(s))  Blood Culture (routine x 2)     Status: None (Preliminary result)   Collection  Time: 12/18/18  4:35 PM  Result Value Ref Range Status   Specimen Description   Final    BLOOD LEFT ANTECUBITAL Performed at [facility] [address] [place], [place] [address]    Special Requests   Final    BOTTLES DRAWN AEROBIC AND ANAEROBIC Blood Culture adequate  volume Performed at [facility] [address], [place], [place] [address]    Culture   Final    NO GROWTH < 24 HOURS Performed at [facility] [address], [place], [place] [address]    Report Status PENDING  Incomplete  SARS Coronavirus 2 [facility] order, Performed in [facility] hospital lab)     Status: Abnormal   Collection Time: 12/18/18  4:35 PM  Result Value Ref Range Status   SARS Coronavirus 2 POSITIVE (A) NEGATIVE Final    Comment: RESULT CALLED TO, READ BACK BY AND VERIFIED WITH: REYNOZA,M. RN @1845  ON 05.18.2020 BY COHEN,K (NOTE) If result is NEGATIVE SARS-CoV-2 target nucleic acids are NOT DETECTED. The SARS-CoV-2 RNA is generally detectable in upper and lower  respiratory specimens during the acute phase of infection. The lowest  concentration of SARS-CoV-2 viral copies this assay can detect is 250  copies / mL. A negative result does not preclude SARS-CoV-2 infection  and should not be used as the sole basis for treatment or other  patient management decisions.  A negative result may occur with  improper specimen collection / handling, submission of specimen other  than nasopharyngeal swab, presence of viral mutation(s) within the  areas targeted by this assay, and inadequate number of viral copies  (<250 copies / mL). A negative result must be combined with clinical  observations, patient history, and epidemiological information. If result is POSITIVE SARS-CoV-2 target nucleic acids are DET ECTED. The SARS-CoV-2 RNA is generally detectable in upper and lower  respiratory specimens during the acute phase of infection.  Positive  results are indicative of active infection with SARS-CoV-2.  Clinical  correlation with patient history and other diagnostic information is  necessary to determine patient infection status.  Positive results do  not rule out bacterial infection or co-infection with other viruses. If result is PRESUMPTIVE  POSTIVE SARS-CoV-2 nucleic acids MAY BE PRESENT.   A presumptive positive result was obtained on the submitted specimen  and confirmed on repeat testing.  While 2019 novel coronavirus  (SARS-CoV-2) nucleic acids may be present in the submitted sample  additional confirmatory testing may be necessary for epidemiological  and / or clinical management purposes  to differentiate between  SARS-CoV-2 and other Sarbecovirus currently known to infect humans.  If clinically indicated additional testing with an alternate test  methodology ([place]) is advised. The SARS-CoV-2 RNA is generally  detectable in upper and lower respiratory specimens during the acute  phase of infection. The expected result is Negative. Fact Sheet for Patients:  StrictlyIdeas.no Fact Sheet for Healthcare Providers: BankingDealers.co.za This test is not yet approved or cleared by the [place] FDA and has been authorized for detection and/or diagnosis of SARS-CoV-2 by FDA under an Emergency Use Authorization (EUA).  This EUA will remain in effect (meaning this test can be used) for the duration of the COVID-19 declaration under Section 564(b)(1) of the Act, 21 U.S.C. section 360bbb-3(b)(1), unless the authorization is terminated or revoked sooner. Performed at [facility], [place] [address] [place], [place] [address]   Blood Culture (routine x 2)     Status:  None (Preliminary result)   Collection Time: 12/18/18  4:50 PM  Result Value Ref Range Status   Specimen Description   Final    BLOOD RIGHT ANTECUBITAL Performed at [facility] [address], [place], [place] [address]    Special Requests   Final    BOTTLES DRAWN AEROBIC AND ANAEROBIC Blood Culture adequate volume Performed at [facility] [address], [place], [place] [address]    Culture   Final    NO GROWTH < 24 HOURS Performed at [facility] [address], [place], [place] [address]    Report Status PENDING  Incomplete  Respiratory Panel by PCR     Status: None   Collection Time: 12/19/18  2:58 AM  Result Value Ref Range Status   Adenovirus NOT DETECTED NOT DETECTED Final   Coronavirus 229E NOT DETECTED NOT DETECTED Final    Comment: (NOTE) The Coronavirus on the Respiratory Panel, DOES NOT test for the novel  Coronavirus (2019 nCoV)    Coronavirus HKU1 NOT DETECTED NOT DETECTED Final   Coronavirus NL63 NOT DETECTED NOT DETECTED Final   Coronavirus OC43 NOT DETECTED NOT DETECTED Final   Metapneumovirus NOT DETECTED NOT DETECTED Final   Rhinovirus / Enterovirus NOT DETECTED NOT DETECTED Final   Influenza A NOT DETECTED NOT DETECTED Final   Influenza B NOT DETECTED NOT DETECTED Final   Parainfluenza Virus 1 NOT DETECTED NOT DETECTED Final   Parainfluenza Virus 2 NOT DETECTED NOT DETECTED Final   Parainfluenza Virus 3 NOT DETECTED NOT DETECTED Final   Parainfluenza Virus 4 NOT DETECTED NOT DETECTED Final   Respiratory Syncytial Virus NOT DETECTED NOT DETECTED Final   Bordetella pertussis NOT DETECTED NOT DETECTED Final   Chlamydophila pneumoniae NOT DETECTED NOT DETECTED Final   Mycoplasma pneumoniae NOT DETECTED NOT DETECTED Final    Comment: Performed at [facility], [address], [place], [place] [address]     Labs: BNP (last 3 results) Recent Labs    12/18/18 1635 12/19/18 0425  BNP 33.8 67.0   Basic Metabolic Panel: Recent Labs  Lab 12/18/18 1635 12/19/18 0425 12/20/18 0517  NA 135 136 134*  K 3.9 3.7 4.0  CL 103 103 103  CO2 23 22 23   GLUCOSE 90 103* 207*  BUN 10 12 20   CREATININE 0.93 0.85 0.86  CALCIUM 8.3* 7.9* 8.6*  MG  --  2.0 2.1  PHOS  --  3.4 3.8   Liver Function Tests: Recent Labs  Lab 12/18/18 1635 12/19/18 0425 12/20/18 0517  AST 65* 73* 61*  ALT 62* 73* 93*  ALKPHOS 40 35* 42  BILITOT 0.7 0.4 0.5  PROT 7.4 6.5 6.9  ALBUMIN 3.2* 2.8* 2.9*   No results  for input(s): LIPASE, AMYLASE in the last 168 hours. No results for input(s): AMMONIA in the last 168 hours. CBC: Recent Labs  Lab 12/18/18 1635 12/19/18 0425 12/20/18 0517  WBC 6.7 5.9 9.1  NEUTROABS 4.6 3.6 6.2  HGB 12.3* 11.8* 12.3*  HCT 37.4* 35.2* 36.6*  MCV 93.3 91.9 91.7  PLT 254 231 279   Cardiac Enzymes: Recent Labs  Lab 12/18/18 1635 12/19/18 0425 12/20/18 0517  CKTOTAL  --  62 68  TROPONINI <0.03  --   --    BNP: Invalid input(s): POCBNP CBG: No results for input(s): GLUCAP in the last 168 hours. D-Dimer Recent Labs    12/19/18 0425 12/20/18 0517  DDIMER 1.40* 1.36*   Hgb A1c  No results for input(s): HGBA1C in the last 72 hours. Lipid Profile Recent Labs    12/18/18 1635  TRIG 88   Thyroid function studies Recent Labs    12/19/18 0425  TSH 0.516   Anemia work up Recent Labs    12/20/18 0513 12/20/18 0517  FERRITIN 342* 357*   Urinalysis No results found for: COLORURINE, APPEARANCEUR, LABSPEC, [place], GLUCOSEU, [place], BILIRUBINUR, KETONESUR, PROTEINUR, UROBILINOGEN, NITRITE, LEUKOCYTESUR Sepsis Labs Invalid input(s): PROCALCITONIN,  WBC,  LACTICIDVEN Microbiology Recent Results (from the past 240 hour(s))  Blood Culture (routine x 2)     Status: None (Preliminary result)   Collection Time: 12/18/18  4:35 PM  Result Value Ref Range Status   Specimen Description   Final    BLOOD LEFT ANTECUBITAL Performed at [facility] [address], [place], [place] [address]    Special Requests   Final    BOTTLES DRAWN AEROBIC AND ANAEROBIC Blood Culture adequate volume Performed at [facility] [address], [place], [place] [address]    Culture   Final    NO GROWTH < 24 HOURS Performed at [facility] [address], [place], [place] [address]    Report Status PENDING  Incomplete  SARS Coronavirus 2 [facility] order, Performed in [facility] hospital lab)     Status: Abnormal   Collection  Time: 12/18/18  4:35 PM  Result Value Ref Range Status   SARS Coronavirus 2 POSITIVE (A) NEGATIVE Final    Comment: RESULT CALLED TO, READ BACK BY AND VERIFIED WITH: REYNOZA,M. RN @1845  ON 05.18.2020 BY COHEN,K (NOTE) If result is NEGATIVE SARS-CoV-2 target nucleic acids are NOT DETECTED. The SARS-CoV-2 RNA is generally detectable in upper and lower  respiratory specimens during the acute phase of infection. The lowest  concentration of SARS-CoV-2 viral copies this assay can detect is 250  copies / mL. A negative result does not preclude SARS-CoV-2 infection  and should not be used as the sole basis for treatment or other  patient management decisions.  A negative result may occur with  improper specimen collection / handling, submission of specimen other  than nasopharyngeal swab, presence of viral mutation(s) within the  areas targeted by this assay, and inadequate number of viral copies  (<250 copies / mL). A negative result must be combined with clinical  observations, patient history, and epidemiological information. If result is POSITIVE SARS-CoV-2 target nucleic acids are DET ECTED. The SARS-CoV-2 RNA is generally detectable in upper and lower  respiratory specimens during the acute phase of infection.  Positive  results are indicative of active infection with SARS-CoV-2.  Clinical  correlation with patient history and other diagnostic information is  necessary to determine patient infection status.  Positive results do  not rule out bacterial infection or co-infection with other viruses. If result is PRESUMPTIVE POSTIVE SARS-CoV-2 nucleic acids MAY BE PRESENT.   A presumptive positive result was obtained on the submitted specimen  and confirmed on repeat testing.  While 2019 novel coronavirus  (SARS-CoV-2) nucleic acids may be present in the submitted sample  additional confirmatory testing may be necessary for epidemiological  and / or clinical management purposes  to  differentiate between  SARS-CoV-2 and other Sarbecovirus currently known to infect humans.  If clinically indicated additional testing with an alternate test  methodology ([place]) is advised. The SARS-CoV-2 RNA is generally  detectable in upper and lower respiratory specimens during the acute  phase of infection. The expected result  is Negative. Fact Sheet for Patients:  StrictlyIdeas.no Fact Sheet for Healthcare Providers: BankingDealers.co.za This test is not yet approved or cleared by the [place] FDA and has been authorized for detection and/or diagnosis of SARS-CoV-2 by FDA under an Emergency Use Authorization (EUA).  This EUA will remain in effect (meaning this test can be used) for the duration of the COVID-19 declaration under Section 564(b)(1) of the Act, 21 U.S.C. section 360bbb-3(b)(1), unless the authorization is terminated or revoked sooner. Performed at [facility], [place] [address] [place], [place] [address]   Blood Culture (routine x 2)     Status: None (Preliminary result)   Collection Time: 12/18/18  4:50 PM  Result Value Ref Range Status   Specimen Description   Final    BLOOD RIGHT ANTECUBITAL Performed at [facility] [address] [place], [place] [address]    Special Requests   Final    BOTTLES DRAWN AEROBIC AND ANAEROBIC Blood Culture adequate volume Performed at [facility] [address] [place], [place] [address]    Culture   Final    NO GROWTH < 24 HOURS Performed at [facility] [address] [place], [place] [address]    Report Status PENDING  Incomplete  Respiratory Panel by PCR     Status: None   Collection Time: 12/19/18  2:58 AM  Result Value Ref Range Status   Adenovirus NOT DETECTED NOT DETECTED Final   Coronavirus 229E NOT DETECTED NOT DETECTED Final    Comment: (NOTE) The Coronavirus on the Respiratory Panel, DOES  NOT test for the novel  Coronavirus (2019 nCoV)    Coronavirus HKU1 NOT DETECTED NOT DETECTED Final   Coronavirus NL63 NOT DETECTED NOT DETECTED Final   Coronavirus OC43 NOT DETECTED NOT DETECTED Final   Metapneumovirus NOT DETECTED NOT DETECTED Final   Rhinovirus / Enterovirus NOT DETECTED NOT DETECTED Final   Influenza A NOT DETECTED NOT DETECTED Final   Influenza B NOT DETECTED NOT DETECTED Final   Parainfluenza Virus 1 NOT DETECTED NOT DETECTED Final   Parainfluenza Virus 2 NOT DETECTED NOT DETECTED Final   Parainfluenza Virus 3 NOT DETECTED NOT DETECTED Final   Parainfluenza Virus 4 NOT DETECTED NOT DETECTED Final   Respiratory Syncytial Virus NOT DETECTED NOT DETECTED Final   Bordetella pertussis NOT DETECTED NOT DETECTED Final   Chlamydophila pneumoniae NOT DETECTED NOT DETECTED Final   Mycoplasma pneumoniae NOT DETECTED NOT DETECTED Final    Comment: Performed at [facility], [address], [place], [place] [address]     Time coordinating discharge: 40 minutes  SIGNED:   Charlynne Cousins, MD  [facility]

## 2018-12-21 LAB — INTERLEUKIN-6, PLASMA: Interleukin-6, Plasma: 2.3 pg/mL (ref 0.0–12.2)

## 2018-12-22 ENCOUNTER — Telehealth: Payer: Self-pay | Admitting: Internal Medicine

## 2018-12-22 LAB — LEGIONELLA PNEUMOPHILA SEROGP 1 UR AG: L. pneumophila Serogp 1 Ur Ag: NEGATIVE

## 2018-12-22 NOTE — Telephone Encounter (Signed)
The patient needs TCM (post hospital visit). He was diagnosed with COVID-19, onset of symptoms 12-2018, discharged from the hospital 12/20/2018. My nurse call and he is reluctant to be seen virtually.  I do recommend telemedicine visit next week, depending of his status we could see him in person but we needs to assess him virtually first. We will need a CMP and CBC.  Called: unable to leave a message

## 2018-12-23 LAB — CULTURE, BLOOD (ROUTINE X 2)
Culture: NO GROWTH
Culture: NO GROWTH
Special Requests: ADEQUATE
Special Requests: ADEQUATE

## 2018-12-29 NOTE — Telephone Encounter (Signed)
Called and spoke to pt regarding Travis Greene. Pt states he is not good with that stuff. Offered to get virtual visit scheduled for pt with Dr. Larose Kells. Pt states that he does not want virtual visit because he gets charged the same money and he feels he needs to be seen in person. Pt noted he is feeling much better and did not note any symptoms currently. Per Kaylyn/Robin Dr. Larose Kells needs to speak to pt. Advised pt Dr. Larose Kells will try to call him back.

## 2018-12-29 NOTE — Telephone Encounter (Signed)
Please call Pt to schedule cpe in person- Paz's in person day is Tuesday 6/2.

## 2018-12-29 NOTE — Telephone Encounter (Signed)
Schedule a face-to-face physical exam for next week. =============== I spoke with the patient. Symptom onset approximately 12/04/2018 was admitted to the hospital and eventually discharged home 12/20/2018. Since then he is taking his temperature and had no fever. Denies cough, nausea, vomiting, diarrhea, shortness of breath or chest pain. Recommend to continue taking all the precautions, cancel next week visit if he has any symptoms, definitely use a mask when he comes

## 2019-01-01 NOTE — Telephone Encounter (Signed)
Pt schedule for 01-02-2019 at 9:00 for cpe. Done

## 2019-01-02 ENCOUNTER — Other Ambulatory Visit: Payer: Self-pay

## 2019-01-02 ENCOUNTER — Ambulatory Visit (INDEPENDENT_AMBULATORY_CARE_PROVIDER_SITE_OTHER): Payer: BC Managed Care – PPO | Admitting: Internal Medicine

## 2019-01-02 ENCOUNTER — Encounter: Payer: Self-pay | Admitting: Internal Medicine

## 2019-01-02 VITALS — BP 122/84 | HR 68 | Temp 98.8°F | Ht 66.0 in | Wt 243.4 lb

## 2019-01-02 DIAGNOSIS — U071 COVID-19: Secondary | ICD-10-CM | POA: Diagnosis not present

## 2019-01-02 DIAGNOSIS — J1289 Other viral pneumonia: Secondary | ICD-10-CM

## 2019-01-02 DIAGNOSIS — R6510 Systemic inflammatory response syndrome (SIRS) of non-infectious origin without acute organ dysfunction: Secondary | ICD-10-CM

## 2019-01-02 LAB — CBC WITH DIFFERENTIAL/PLATELET
Basophils Absolute: 0.1 10*3/uL (ref 0.0–0.1)
Basophils Relative: 1.1 % (ref 0.0–3.0)
Eosinophils Absolute: 0 10*3/uL (ref 0.0–0.7)
Eosinophils Relative: 0.5 % (ref 0.0–5.0)
HCT: 37.2 % — ABNORMAL LOW (ref 39.0–52.0)
Hemoglobin: 12.9 g/dL — ABNORMAL LOW (ref 13.0–17.0)
Lymphocytes Relative: 36.1 % (ref 12.0–46.0)
Lymphs Abs: 2.2 10*3/uL (ref 0.7–4.0)
MCHC: 34.7 g/dL (ref 30.0–36.0)
MCV: 90.8 fl (ref 78.0–100.0)
Monocytes Absolute: 0.7 10*3/uL (ref 0.1–1.0)
Monocytes Relative: 12 % (ref 3.0–12.0)
Neutro Abs: 3 10*3/uL (ref 1.4–7.7)
Neutrophils Relative %: 50.3 % (ref 43.0–77.0)
Platelets: 268 10*3/uL (ref 150.0–400.0)
RBC: 4.1 Mil/uL — ABNORMAL LOW (ref 4.22–5.81)
RDW: 13.7 % (ref 11.5–15.5)
WBC: 6.1 10*3/uL (ref 4.0–10.5)

## 2019-01-02 LAB — COMPREHENSIVE METABOLIC PANEL
ALT: 41 U/L (ref 0–53)
AST: 22 U/L (ref 0–37)
Albumin: 3.6 g/dL (ref 3.5–5.2)
Alkaline Phosphatase: 59 U/L (ref 39–117)
BUN: 16 mg/dL (ref 6–23)
CO2: 29 mEq/L (ref 19–32)
Calcium: 8.7 mg/dL (ref 8.4–10.5)
Chloride: 103 mEq/L (ref 96–112)
Creatinine, Ser: 0.86 mg/dL (ref 0.40–1.50)
GFR: 92.28 mL/min (ref >60.00)
Glucose, Bld: 99 mg/dL (ref 70–99)
Potassium: 4.7 mEq/L (ref 3.5–5.1)
Sodium: 138 mEq/L (ref 135–145)
Total Bilirubin: 0.4 mg/dL (ref 0.2–1.2)
Total Protein: 6.2 g/dL (ref 6.0–8.3)

## 2019-01-02 NOTE — Patient Instructions (Addendum)
Get the blood work     [place] Schedule your next appointment  For a physical exam in 3-4 months    SIGA TOMANDO TODAS LAS PRECAUCIONES   REGRESE EN 2 SEMANAS AL [place] [facility] [place], [place]  [place]

## 2019-01-02 NOTE — Assessment & Plan Note (Signed)
COVID-19, pneumonia and Sirs: Symptoms of started 12/04/2018, admitted to the hospital 12/18/2018 and discharged 2 days later.  Currently completely asymptomatic. Plan: CMP, CBC Chest x-ray in 2 weeks Continue taking all the appropriate precautions Return to the office if symptoms. RTC 3 months CPX

## 2019-01-02 NOTE — Progress Notes (Signed)
Subjective:    Patient ID: Travis Greene, male    DOB: 22-Jul-1966, 53 y.o.   MRN: [id number]  DOS:  01/02/2019 Type of visit - description: Hospital follow-up Admitted to hospital discharge 12/20/2018. Initial symptoms were fever, cough, fatigue, headache and anosmia. He was diagnosed with COVID-19 pneumonia. Was treated conservatively, he gradually improve. Last BMP satisfactory, LFTs increased. CBC showed mild anemia, chest x-ray showed multifocal airspace opacity.   Review of Systems Since he left the hospital he is getting gradually better. Currently with no fever, chills No chest pain no difficulty breathing No edema No nausea, vomiting, diarrhea. No cough  Past Medical History:  Diagnosis Date  . Chest pain    negative stress test 11/2009  . Colitis   . ED (erectile dysfunction) 11/09  . Hyperthyroidism 12/30/2011  . Ulcerative colitis Dx 2005   [facility], last Cscope 2010 Dr Oletta Lamas, was Rx lialda  . Vision abnormalities     Past Surgical History:  Procedure Laterality Date  . APPENDECTOMY    . VASECTOMY      Social History   Socioeconomic History  . Marital status: Married    Spouse name: Not on file  . Number of children: 5  . Years of education: Not on file  . Highest education level: Not on file  Occupational History  . Occupation:  Building control surveyor   Social Needs  . Financial resource strain: Not on file  . Food insecurity:    Worry: Not on file    Inability: Not on file  . Transportation needs:    Medical: Not on file    Non-medical: Not on file  Tobacco Use  . Smoking status: Former Smoker    Packs/day: 1.00    Years: 2.00    Pack years: 2.00    Types: Cigarettes    Last attempt to quit: 11/06/1997    Years since quitting: 21.1  . Smokeless tobacco: Never Used  Substance and Sexual Activity  . Alcohol use: Yes    Comment: socially  . Drug use: No    Comment: multiple drugs before, now rarely uses marijuana  . Sexual activity: Not on  file  Lifestyle  . Physical activity:    Days per week: Not on file    Minutes per session: Not on file  . Stress: Not on file  Relationships  . Social connections:    Talks on phone: Not on file    Gets together: Not on file    Attends religious service: Not on file    Active member of club or organization: Not on file    Attends meetings of clubs or organizations: Not on file    Relationship status: Not on file  . Intimate partner violence:    Fear of current or ex partner: Not on file    Emotionally abused: Not on file    Physically abused: Not on file    Forced sexual activity: Not on file  Other Topics Concern  . Not on file  Social History Narrative   [name], from [place] w/ wife and 3 of his 5 children      Allergies as of 01/02/2019   No Known Allergies     Medication List       Accurate as of January 02, 2019  9:14 PM. If you have any questions, ask your nurse or doctor.        STOP taking these medications   gabapentin 300 MG  capsule Commonly known as:  NEURONTIN Stopped by:  Kathlene November, MD   mesalamine 1.2 g EC tablet Commonly known as:  LIALDA Stopped by:  Kathlene November, MD     TAKE these medications   esomeprazole 40 MG capsule Commonly known as:  NEXIUM           Objective:   Physical Exam BP 122/84 (BP Location: Left Arm, Patient Position: Sitting, Cuff Size: Large)   Pulse 68   Temp 98.8 F (37.1 C) (Oral)   Ht 5' 6"  (1.676 m)   Wt 243 lb 6.4 oz (110.4 kg)   SpO2 99%   BMI 39.29 kg/m  General:   Well developed, NAD, BMI noted. HEENT:  Normocephalic . Face symmetric, atraumatic Lungs:  CTA B Normal respiratory effort, no intercostal retractions, no accessory muscle use. Heart: RRR,  no murmur.  No pretibial edema bilaterally  Skin: Not pale. Not jaundice Neurologic:  alert & oriented X3.  Speech normal, gait appropriate for age and unassisted Psych--  Cognition and judgment appear intact.  Cooperative with normal  attention span and concentration.  Behavior appropriate. No anxious or depressed appearing.      Assessment     Assessment Ulcerative colitis, DX 2005, Dr. Oletta Lamas, [place]  2010, on [place] H/o ED Eczema - feet Vit d def dx 11-2015 Chest pain  (-)  stress test 2011 H/o hyperthyroidism Illiterate   PLAN COVID-19, pneumonia and Sirs: Symptoms of started 12/04/2018, admitted to the hospital 12/18/2018 and discharged 2 days later.  Currently completely asymptomatic. Plan: CMP, CBC Chest x-ray in 2 weeks Continue taking all the appropriate precautions Return to the office if symptoms. RTC 3 months CPX

## 2019-01-26 ENCOUNTER — Other Ambulatory Visit: Payer: Self-pay

## 2019-01-26 ENCOUNTER — Ambulatory Visit (HOSPITAL_BASED_OUTPATIENT_CLINIC_OR_DEPARTMENT_OTHER)
Admission: RE | Admit: 2019-01-26 | Discharge: 2019-01-26 | Disposition: A | Discharge status: Home / Self Care | Payer: BC Managed Care – PPO | Source: Ambulatory Visit | Attending: Internal Medicine | Admitting: Internal Medicine

## 2019-01-26 DIAGNOSIS — J189 Pneumonia, unspecified organism: Secondary | ICD-10-CM | POA: Diagnosis not present

## 2019-01-26 DIAGNOSIS — U071 COVID-19: Secondary | ICD-10-CM | POA: Diagnosis not present

## 2019-04-26 ENCOUNTER — Other Ambulatory Visit: Payer: Self-pay | Admitting: Neurology

## 2019-06-18 DIAGNOSIS — K5190 Ulcerative colitis, unspecified, without complications: Secondary | ICD-10-CM | POA: Diagnosis not present

## 2019-10-29 ENCOUNTER — Other Ambulatory Visit: Payer: Self-pay

## 2019-10-30 ENCOUNTER — Other Ambulatory Visit: Payer: Self-pay

## 2019-10-30 ENCOUNTER — Ambulatory Visit (INDEPENDENT_AMBULATORY_CARE_PROVIDER_SITE_OTHER): Payer: BC Managed Care – PPO | Admitting: Internal Medicine

## 2019-10-30 ENCOUNTER — Encounter: Payer: Self-pay | Admitting: Internal Medicine

## 2019-10-30 VITALS — BP 134/86 | HR 68 | Temp 98.2°F | Resp 18 | Ht 66.0 in | Wt 245.5 lb

## 2019-10-30 DIAGNOSIS — H6690 Otitis media, unspecified, unspecified ear: Secondary | ICD-10-CM | POA: Diagnosis not present

## 2019-10-30 DIAGNOSIS — M542 Cervicalgia: Secondary | ICD-10-CM | POA: Diagnosis not present

## 2019-10-30 MED ORDER — AMOXICILLIN 875 MG PO TABS: 875.0000 mg | ORAL_TABLET | Freq: Two times a day (BID) | ORAL | 0 refills | Status: DC

## 2019-10-30 MED ORDER — CYCLOBENZAPRINE HCL 10 MG PO TABS: 10.0000 mg | ORAL_TABLET | Freq: Two times a day (BID) | ORAL | 0 refills | Status: DC | PRN

## 2019-10-30 NOTE — Progress Notes (Signed)
Pre visit review using our clinic review tool, if applicable. No additional management support is needed unless otherwise documented below in the visit note. 

## 2019-10-30 NOTE — Progress Notes (Signed)
Subjective:    Patient ID: Travis Greene, male    DOB: 1966-03-27, 54 y.o.   MRN: [id number]  DOS:  10/30/2019 Type of visit - description: Acute Multiple issues History of "lumps" on the inner legs on and off, he has a one that is large and is not going away. They are at times red and warm but has never seen discharge.  Right neck pain: Started few days ago, worse at night, the pain is located at the neck and upper trapezoid area, denies radiation to the arm.  No numbness, no injury.  Having left ear tinnitus and some decrease in the hearing for 2 to 3 weeks. Denies any pain, discharge or URI type of symptoms.   Review of Systems See above   Past Medical History:  Diagnosis Date  . Chest pain    negative stress test 11/2009  . Colitis   . ED (erectile dysfunction) 11/09  . Hyperthyroidism 12/30/2011  . Ulcerative colitis Dx 2005   [facility], last Cscope 2010 Dr Oletta Lamas, was Rx lialda  . Vision abnormalities     Past Surgical History:  Procedure Laterality Date  . APPENDECTOMY    . VASECTOMY      Allergies as of 10/30/2019   No Known Allergies     Medication List       Accurate as of October 30, 2019 11:49 AM. If you have any questions, ask your nurse or doctor.        esomeprazole 40 MG capsule Commonly known as: NEXIUM   gabapentin 300 MG capsule Commonly known as: NEURONTIN Take 1 capsule (300 mg total) by mouth 2 (two) times daily.          Objective:   Physical Exam BP 134/86 (BP Location: Left Arm, Patient Position: Sitting, Cuff Size: Normal)   Pulse 68   Temp 98.2 F (36.8 C) (Temporal)   Resp 18   Ht 5' 6"  (1.676 m)   Wt 245 lb 8 oz (111.4 kg)   SpO2 100%   BMI 39.62 kg/m    General:   Well developed, NAD, BMI noted. HEENT:  Normocephalic . Face symmetric, atraumatic. Right TM normal Left TM: Slightly red, no bulge.  Canal is normal. Lungs:  CTA B Normal respiratory effort, no intercostal retractions, no accessory muscle  use. Heart: RRR,  no murmur.  Lower extremities: He has a 2 x 1 cm cystic type of lesion at the right inner leg, no discharge.  Not tender, not warm He has multiple small superficial lumps bilaterally at the inner legs. MSK: Neck full range of motion, no TTP of the cervical spine Neurologic:  alert & oriented X3.  Speech normal, gait appropriate for age and unassisted.  DTR symmetric  psych--  Cognition and judgment appear intact.  Cooperative with normal attention span and concentration.  Behavior appropriate. No anxious or depressed appearing.      Assessment      Assessment Ulcerative colitis, DX 2005, Dr. Oletta Lamas, [place]  2010, on [place] H/o ED Eczema - feet Vit d def dx 11-2015 Chest pain  (-)  stress test 2011 H/o hyperthyroidism Illiterate   PLAN L ear infection: C/o tinnitus and slightly decreased hearing of the left ear, on exam there is some redness at the TM, suspect infection, treat with amoxicillin for a week, if not better will need further eval. Neck pain: Pain is located between the neck and the trapezial area on the right side, recommend Tylenol, Flexeril, call if  not better.  Reports pain is mild Cystic lesions: On and off cystic lesions on the inner leg, on the right side he has a large one that has not gone away.  Recommend I&D, advised to schedule for next week. RTC 1 week   This visit occurred during the SARS-CoV-2 public health emergency.  Safety protocols were in place, including screening questions prior to the visit, additional usage of staff PPE, and extensive cleaning of exam room while observing appropriate contact time as indicated for disinfecting solutions.

## 2019-10-30 NOTE — Patient Instructions (Addendum)
    GO TO THE FRONT DESK, please reschedule your appointments Come back next week    AMOXICILIN PARA EL OIDO , DOS VECES AL DIA POR UNA SEMANA  PARA EL DOLOR: TYLENOL 1 O 2 PASTILLAS 3 VECES AL DIA CYCLOBENZAPRINE ES UN RELAJANTE MUSCULAR , 2 VECES AL DIA SI LO NECESITA   REGRESE LA PROXIMA [facility]

## 2019-10-31 NOTE — Assessment & Plan Note (Signed)
L ear infection: C/o tinnitus and slightly decreased hearing of the left ear, on exam there is some redness at the TM, suspect infection, treat with amoxicillin for a week, if not better will need further eval. Neck pain: Pain is located between the neck and the trapezial area on the right side, recommend Tylenol, Flexeril, call if not better.  Reports pain is mild Cystic lesions: On and off cystic lesions on the inner leg, on the right side he has a large one that has not gone away.  Recommend I&D, advised to schedule for next week. RTC 1 week

## 2019-11-01 ENCOUNTER — Ambulatory Visit: Payer: BC Managed Care – PPO | Attending: Internal Medicine

## 2019-11-01 DIAGNOSIS — Z23 Encounter for immunization: Secondary | ICD-10-CM

## 2019-11-01 NOTE — Progress Notes (Signed)
   Covid-19 Vaccination Clinic  Name:  [name]    MRN: [id number] DOB: 1965-09-27  11/01/2019  Mr. Schanz was observed post Covid-19 immunization for 15 minutes without incident. He was provided with Vaccine Information Sheet and instruction to access the V-Safe system.   Mr. Keenum was instructed to call 911 with any severe reactions post vaccine: Marland Kitchen Difficulty breathing  . Swelling of face and throat  . A fast heartbeat  . A bad rash all over body  . Dizziness and weakness   Immunizations Administered    Name Date Dose VIS Date Route   Pfizer COVID-19 Vaccine 11/01/2019 10:56 AM 0.3 mL 07/13/2019 Intramuscular   Manufacturer: [place]   Lot: [id number]   [place]: [id number]

## 2019-11-06 ENCOUNTER — Other Ambulatory Visit: Payer: Self-pay

## 2019-11-06 ENCOUNTER — Encounter: Payer: Self-pay | Admitting: Internal Medicine

## 2019-11-06 ENCOUNTER — Ambulatory Visit (INDEPENDENT_AMBULATORY_CARE_PROVIDER_SITE_OTHER): Payer: BC Managed Care – PPO | Admitting: Internal Medicine

## 2019-11-06 VITALS — BP 129/78 | HR 83 | Temp 98.3°F | Resp 16 | Ht 66.0 in | Wt 246.4 lb

## 2019-11-06 DIAGNOSIS — H6502 Acute serous otitis media, left ear: Secondary | ICD-10-CM

## 2019-11-06 DIAGNOSIS — L729 Follicular cyst of the skin and subcutaneous tissue, unspecified: Secondary | ICD-10-CM

## 2019-11-06 MED ORDER — AMOXICILLIN 875 MG PO TABS: 875.0000 mg | ORAL_TABLET | Freq: Two times a day (BID) | ORAL | 0 refills | Status: DC

## 2019-11-06 NOTE — Progress Notes (Signed)
Pre visit review using our clinic review tool, if applicable. No additional management support is needed unless otherwise documented below in the visit note. 

## 2019-11-06 NOTE — Progress Notes (Signed)
Subjective:    Patient ID: Travis Greene, male    DOB: May 08, 1966, 54 y.o.   MRN: [id number]  DOS:  11/06/2019 Type of visit - description: Follow-up previous visit Was diagnosed with a left ear infection, took amoxicillin, symptoms are about the same. Also had cystic lesions at the inner leg, the larger one has actually improved, he thinks related to antibiotics.  He denies fever chills No redness or discharge from the cyst and the inner ear.  Review of Systems See above   Past Medical History:  Diagnosis Date  . Chest pain    negative stress test 11/2009  . Colitis   . ED (erectile dysfunction) 11/09  . Hyperthyroidism 12/30/2011  . Ulcerative colitis Dx 2005   [facility], last Cscope 2010 Dr Oletta Lamas, was Rx lialda  . Vision abnormalities     Past Surgical History:  Procedure Laterality Date  . APPENDECTOMY    . VASECTOMY      Allergies as of 11/06/2019   No Known Allergies     Medication List       Accurate as of November 06, 2019 11:59 PM. If you have any questions, ask your nurse or doctor.        amoxicillin 875 MG tablet Commonly known as: AMOXIL Take 1 tablet (875 mg total) by mouth 2 (two) times daily.   cyclobenzaprine 10 MG tablet Commonly known as: FLEXERIL Take 1 tablet (10 mg total) by mouth 2 (two) times daily as needed for muscle spasms (neck pain).          Objective:   Physical Exam BP 129/78 (BP Location: Left Arm, Patient Position: Sitting, Cuff Size: Normal)   Pulse 83   Temp 98.3 F (36.8 C) (Temporal)   Resp 16   Ht 5' 6"  (1.676 m)   Wt 246 lb 6 oz (111.8 kg)   SpO2 98%   BMI 39.77 kg/m  General:   Well developed, NAD, BMI noted. HEENT:  Normocephalic . Face symmetric, atraumatic. Right TM normal Left TM: Flat, slightly red.  Otherwise canal is normal. Skin: See last visit, previously seen cystic lesion at the right inner ear has definitely decreased in size by 50% at least. Neurologic:  alert & oriented X3.  Speech normal,  gait appropriate for age and unassisted Psych--  Cognition and judgment appear intact.  Cooperative with normal attention span and concentration.  Behavior appropriate. No anxious or depressed appearing.      Assessment      Assessment Ulcerative colitis, DX 2005, Dr. Oletta Lamas, [place]  2010, on [place] H/o ED Eczema - feet Vit d def dx 11-2015 Chest pain  (-)  stress test 2011 H/o hyperthyroidism Illiterate  PLAN Left ear pain: Since the last office visit, was prescribed amoxicillin, mild improvement on tinnitus and decreased hearing.  Exam is about the same. ?  Serous otitis media. He had 1 week worth of amoxicillin, sent additional 5 days Consistent use of Flonase recommended. If not better will call for possibly ENT referral. Cystic lesions, inner leg. He had a 2 x 1 cm cystic lesion last week, now decreased by 50% possibly due to ABX.  No I&D recommended at this point, these cysts tend to recur.  Treat as needed   This visit occurred during the SARS-CoV-2 public health emergency.  Safety protocols were in place, including screening questions prior to the visit, additional usage of staff PPE, and extensive cleaning of exam room while observing appropriate contact time as indicated  for disinfecting solutions.

## 2019-11-06 NOTE — Patient Instructions (Signed)
Use FLONASE (sin receta) : 2 sprays en cada lado de la [employer] dias por 2 o 3 semanas  si el oido no mejora , me avisa para referirlo a Teaching laboratory technician

## 2019-11-07 NOTE — Assessment & Plan Note (Signed)
Left ear pain: Since the last office visit, was prescribed amoxicillin, mild improvement on tinnitus and decreased hearing.  Exam is about the same. ?  Serous otitis media. He had 1 week worth of amoxicillin, sent additional 5 days Consistent use of Flonase recommended. If not better will call for possibly ENT referral. Cystic lesions, inner leg. He had a 2 x 1 cm cystic lesion last week, now decreased by 50% possibly due to ABX.  No I&D recommended at this point, these cysts tend to recur.  Treat as needed

## 2019-11-26 ENCOUNTER — Ambulatory Visit: Payer: BC Managed Care – PPO | Attending: Internal Medicine

## 2019-11-26 DIAGNOSIS — Z23 Encounter for immunization: Secondary | ICD-10-CM

## 2019-11-26 NOTE — Progress Notes (Signed)
   Covid-19 Vaccination Clinic  Name:  Travis Greene    MRN: [id number] DOB: [birth year]  11/26/2019  [name] was observed post Covid-19 immunization for 15 minutes without incident. He was provided with Vaccine Information Sheet and instruction to access the V-Safe system.   [name] was instructed to call 911 with any severe reactions post vaccine: Marland Kitchen Difficulty breathing  . Swelling of face and throat  . A fast heartbeat  . A bad rash all over body  . Dizziness and weakness   Immunizations Administered    Name Date Dose VIS Date Route   Pfizer COVID-19 Vaccine 11/26/2019 10:55 AM 0.3 mL 09/26/2018 Intramuscular   Manufacturer: [place]   Lot: [id number]   [place]: [id number]

## 2020-01-08 ENCOUNTER — Encounter: Payer: BC Managed Care – PPO | Admitting: Internal Medicine

## 2020-01-09 ENCOUNTER — Encounter: Payer: BC Managed Care – PPO | Admitting: Internal Medicine

## 2020-01-09 DIAGNOSIS — Z0289 Encounter for other administrative examinations: Secondary | ICD-10-CM

## 2020-01-21 ENCOUNTER — Other Ambulatory Visit: Payer: Self-pay

## 2020-01-21 ENCOUNTER — Encounter: Payer: Self-pay | Admitting: Internal Medicine

## 2020-01-21 ENCOUNTER — Ambulatory Visit (INDEPENDENT_AMBULATORY_CARE_PROVIDER_SITE_OTHER): Payer: BC Managed Care – PPO | Admitting: Internal Medicine

## 2020-01-21 VITALS — BP 136/91 | HR 69 | Temp 97.9°F | Resp 16 | Ht 66.0 in | Wt 241.2 lb

## 2020-01-21 DIAGNOSIS — D367 Benign neoplasm of other specified sites: Secondary | ICD-10-CM | POA: Diagnosis not present

## 2020-01-21 DIAGNOSIS — R739 Hyperglycemia, unspecified: Secondary | ICD-10-CM

## 2020-01-21 DIAGNOSIS — Z125 Encounter for screening for malignant neoplasm of prostate: Secondary | ICD-10-CM

## 2020-01-21 DIAGNOSIS — Z23 Encounter for immunization: Secondary | ICD-10-CM

## 2020-01-21 DIAGNOSIS — E559 Vitamin D deficiency, unspecified: Secondary | ICD-10-CM | POA: Diagnosis not present

## 2020-01-21 DIAGNOSIS — Z0000 Encounter for general adult medical examination without abnormal findings: Secondary | ICD-10-CM

## 2020-01-21 DIAGNOSIS — D649 Anemia, unspecified: Secondary | ICD-10-CM | POA: Diagnosis not present

## 2020-01-21 LAB — CBC WITH DIFFERENTIAL/PLATELET
Basophils Absolute: 0.1 10*3/uL (ref 0.0–0.1)
Basophils Relative: 1.2 % (ref 0.0–3.0)
Eosinophils Absolute: 0.2 10*3/uL (ref 0.0–0.7)
Eosinophils Relative: 2.6 % (ref 0.0–5.0)
HCT: 36.7 % — ABNORMAL LOW (ref 39.0–52.0)
Hemoglobin: 11.8 g/dL — ABNORMAL LOW (ref 13.0–17.0)
Lymphocytes Relative: 41.2 % (ref 12.0–46.0)
Lymphs Abs: 2.4 10*3/uL (ref 0.7–4.0)
MCHC: 32 g/dL (ref 30.0–36.0)
MCV: 75.8 fl — ABNORMAL LOW (ref 78.0–100.0)
Monocytes Absolute: 0.7 10*3/uL (ref 0.1–1.0)
Monocytes Relative: 11.8 % (ref 3.0–12.0)
Neutro Abs: 2.5 10*3/uL (ref 1.4–7.7)
Neutrophils Relative %: 43.2 % (ref 43.0–77.0)
Platelets: 293 10*3/uL (ref 150.0–400.0)
RBC: 4.84 Mil/uL (ref 4.22–5.81)
RDW: 20.2 % — ABNORMAL HIGH (ref 11.5–15.5)
WBC: 5.8 10*3/uL (ref 4.0–10.5)

## 2020-01-21 LAB — COMPREHENSIVE METABOLIC PANEL
ALT: 19 U/L (ref 0–53)
AST: 16 U/L (ref 0–37)
Albumin: 3.9 g/dL (ref 3.5–5.2)
Alkaline Phosphatase: 61 U/L (ref 39–117)
BUN: 20 mg/dL (ref 6–23)
CO2: 26 mEq/L (ref 19–32)
Calcium: 8.7 mg/dL (ref 8.4–10.5)
Chloride: 104 mEq/L (ref 96–112)
Creatinine, Ser: 0.81 mg/dL (ref 0.40–1.50)
GFR: 98.51 mL/min (ref >60.00)
Glucose, Bld: 107 mg/dL — ABNORMAL HIGH (ref 70–99)
Potassium: 4.4 mEq/L (ref 3.5–5.1)
Sodium: 137 mEq/L (ref 135–145)
Total Bilirubin: 0.4 mg/dL (ref 0.2–1.2)
Total Protein: 6.5 g/dL (ref 6.0–8.3)

## 2020-01-21 LAB — LIPID PANEL
Cholesterol: 205 mg/dL — ABNORMAL HIGH (ref 0–200)
HDL: 55.2 mg/dL (ref >39.00)
LDL Cholesterol: 134 mg/dL — ABNORMAL HIGH (ref 0–99)
NonHDL: 150.14
Total CHOL/HDL Ratio: 4
Triglycerides: 79 mg/dL (ref 0.0–149.0)
VLDL: 15.8 mg/dL (ref 0.0–40.0)

## 2020-01-21 LAB — HEMOGLOBIN A1C: Hgb A1c MFr Bld: 6.5 % (ref 4.6–6.5)

## 2020-01-21 LAB — PSA: PSA: 0.39 ng/mL (ref 0.10–4.00)

## 2020-01-21 LAB — VITAMIN D 25 HYDROXY (VIT D DEFICIENCY, FRACTURES): VITD: 39.12 ng/mL (ref 30.00–100.00)

## 2020-01-21 LAB — TSH: TSH: 0.61 u[IU]/mL (ref 0.35–4.50)

## 2020-01-21 MED ORDER — CEPHALEXIN 500 MG PO CAPS: 500.0000 mg | ORAL_CAPSULE | Freq: Four times a day (QID) | ORAL | 0 refills | Status: DC

## 2020-01-21 MED ORDER — HYDROCORTISONE 2.5 % EX CREA: TOPICAL_CREAM | Freq: Two times a day (BID) | CUTANEOUS | 0 refills | Status: DC

## 2020-01-21 NOTE — Assessment & Plan Note (Signed)
-  Td 08 - s/p pfizer covid shot  - rec influenza shot this fall  - DRE very limited, no symptoms, check a PSA -Cscope 2010, cscope 08/2017: serrated polyp, + chronic active colitis, 3 years  -Diet and exercise discussed -Labs:  CMP, FLP, CBC A1c PSA, vitamin D, TSH

## 2020-01-21 NOTE — Patient Instructions (Signed)
Take an antibiotic named Keflex for few days Hydrocortisone 2.5% twice a day at the inner leg We are referring you to a surgeon, expect a phone call   [facility] : Get the blood work     [place], [place] back for   physical exam in 1 year

## 2020-01-21 NOTE — Assessment & Plan Note (Addendum)
Here for CPX Ulcerative colitis: Asymptomatic Vitamin D deficiency: Checking labs Cyst,inner right leg: It is getting bigger and causing discomfort, refer to general surgery for consideration of excision.  Today, the surroundings look slightly warm, skin is thick.  Will prescribe Keflex for few days and hydrocortisone cream. All instructions discussed in Spanish written in English Addendum: Mild anemia, will add TIBC RTC 1 year.

## 2020-01-21 NOTE — Progress Notes (Signed)
Pre visit review using our clinic review tool, if applicable. No additional management support is needed unless otherwise documented below in the visit note. 

## 2020-01-21 NOTE — Progress Notes (Signed)
Subjective:    Patient ID: Travis Greene, male    DOB: 1966-05-26, 54 y.o.   MRN: [id number]  DOS:  01/21/2020 Type of visit - description: CPX In general feeling well. Has a cystic lesion on the right inner leg, is getting bigger, at times painful and red.It is rubbing against the other leg.   Review of Systems  Other than above, a 14 point review of systems is negative    Past Medical History:  Diagnosis Date  . Chest pain    negative stress test 11/2009  . Colitis   . ED (erectile dysfunction) 11/09  . Hyperthyroidism 12/30/2011  . Ulcerative colitis Dx 2005   [facility], last Cscope 2010 Dr Oletta Lamas, was Rx lialda  . Vision abnormalities     Past Surgical History:  Procedure Laterality Date  . APPENDECTOMY    . VASECTOMY     Family History  Problem Relation Age of Onset  . Diabetes Mother   . Hypertension Mother        ?  . Coronary artery disease Neg Hx   . Prostate cancer Neg Hx   . Colon cancer Neg Hx      Allergies as of 01/21/2020   No Known Allergies     Medication List       Accurate as of January 21, 2020  9:30 PM. If you have any questions, ask your nurse or doctor.        STOP taking these medications   amoxicillin 875 MG tablet Commonly known as: AMOXIL Stopped by: Kathlene November, MD   cyclobenzaprine 10 MG tablet Commonly known as: FLEXERIL Stopped by: Kathlene November, MD     TAKE these medications   cephALEXin 500 MG capsule Commonly known as: KEFLEX Take 1 capsule (500 mg total) by mouth 4 (four) times daily. Started by: Kathlene November, MD   hydrocortisone 2.5 % cream Apply topically 2 (two) times daily. Started by: Kathlene November, MD          Objective:   Physical Exam Skin:        BP (!) 136/91 (BP Location: Left Arm, Patient Position: Sitting, Cuff Size: Normal)   Pulse 69   Temp 97.9 F (36.6 C) (Temporal)   Resp 16   Ht 5' 6"  (1.676 m)   Wt 241 lb 4 oz (109.4 kg)   SpO2 97%   BMI 38.94 kg/m  General: Well developed, NAD, BMI  noted Neck: No  thyromegaly  HEENT:  Normocephalic . Face symmetric, atraumatic Lungs:  CTA B Normal respiratory effort, no intercostal retractions, no accessory muscle use. Heart: RRR,  no murmur.  Abdomen:  Not distended, soft, non-tender. No rebound or rigidity.   Lower extremities: no pretibial edema bilaterally  Skin: See graphic DRE: Very limited due to patient discomfort, I could not reach the prostate gland Neurologic:  alert & oriented X3.  Speech normal, gait appropriate for age and unassisted Strength symmetric and appropriate for age.  Psych: Cognition and judgment appear intact.  Cooperative with normal attention span and concentration.  Behavior appropriate. No anxious or depressed appearing.     Assessment    Assessment Ulcerative colitis, DX 2005, Dr. Oletta Lamas, [place] at some point H/o ED Eczema - feet Vit d def dx 11-2015 Chest pain  (-)  stress test 2011 H/o hyperthyroidism Illiterate  PLAN Here for CPX Ulcerative colitis: Asymptomatic Vitamin D deficiency: Checking labs Cyst,inner right leg: It is getting bigger and causing discomfort,  refer to general surgery for consideration of excision.  Today, the surroundings look slightly warm, skin is thick.  Will prescribe Keflex for few days and hydrocortisone cream. All instructions discussed in Spanish written in English Addendum: Mild anemia, will add TIBC RTC 1 year.  In addition to CPX, I assessed a worsening problem, cyst at the right leg   This visit occurred during the SARS-CoV-2 public health emergency.  Safety protocols were in place, including screening questions prior to the visit, additional usage of staff PPE, and extensive cleaning of exam room while observing appropriate contact time as indicated for disinfecting solutions.

## 2020-01-23 ENCOUNTER — Other Ambulatory Visit (INDEPENDENT_AMBULATORY_CARE_PROVIDER_SITE_OTHER): Payer: BC Managed Care – PPO

## 2020-01-23 DIAGNOSIS — D649 Anemia, unspecified: Secondary | ICD-10-CM

## 2020-01-23 DIAGNOSIS — D509 Iron deficiency anemia, unspecified: Secondary | ICD-10-CM

## 2020-01-23 LAB — IBC + FERRITIN
Ferritin: 6.9 ng/mL — ABNORMAL LOW (ref 22.0–322.0)
Iron: 47 ug/dL (ref 42–165)
Saturation Ratios: 10.4 % — ABNORMAL LOW (ref 20.0–50.0)
Transferrin: 324 mg/dL (ref 212.0–360.0)

## 2020-01-23 NOTE — Addendum Note (Signed)
Addended by: Kelle Darting A on: 01/23/2020 03:51 PM   Modules accepted: Orders

## 2020-01-25 MED ORDER — IRON 325 (65 FE) MG PO TABS: 1.0000 | ORAL_TABLET | Freq: Two times a day (BID) | ORAL | 6 refills | Status: DC

## 2020-01-31 ENCOUNTER — Other Ambulatory Visit: Payer: Self-pay | Admitting: Internal Medicine

## 2020-02-01 ENCOUNTER — Other Ambulatory Visit: Payer: Self-pay | Admitting: Internal Medicine

## 2020-03-12 ENCOUNTER — Other Ambulatory Visit: Payer: Self-pay | Admitting: Internal Medicine

## 2020-03-14 IMAGING — DX PORTABLE CHEST - 1 VIEW
1 series · 1 of 1 positions shown · non-contrast
Comparison: Radiographs 09/20/2017.

CLINICAL DATA: Fever, chills and fatigue.

EXAM:
PORTABLE CHEST 1 VIEW

[chest ap]
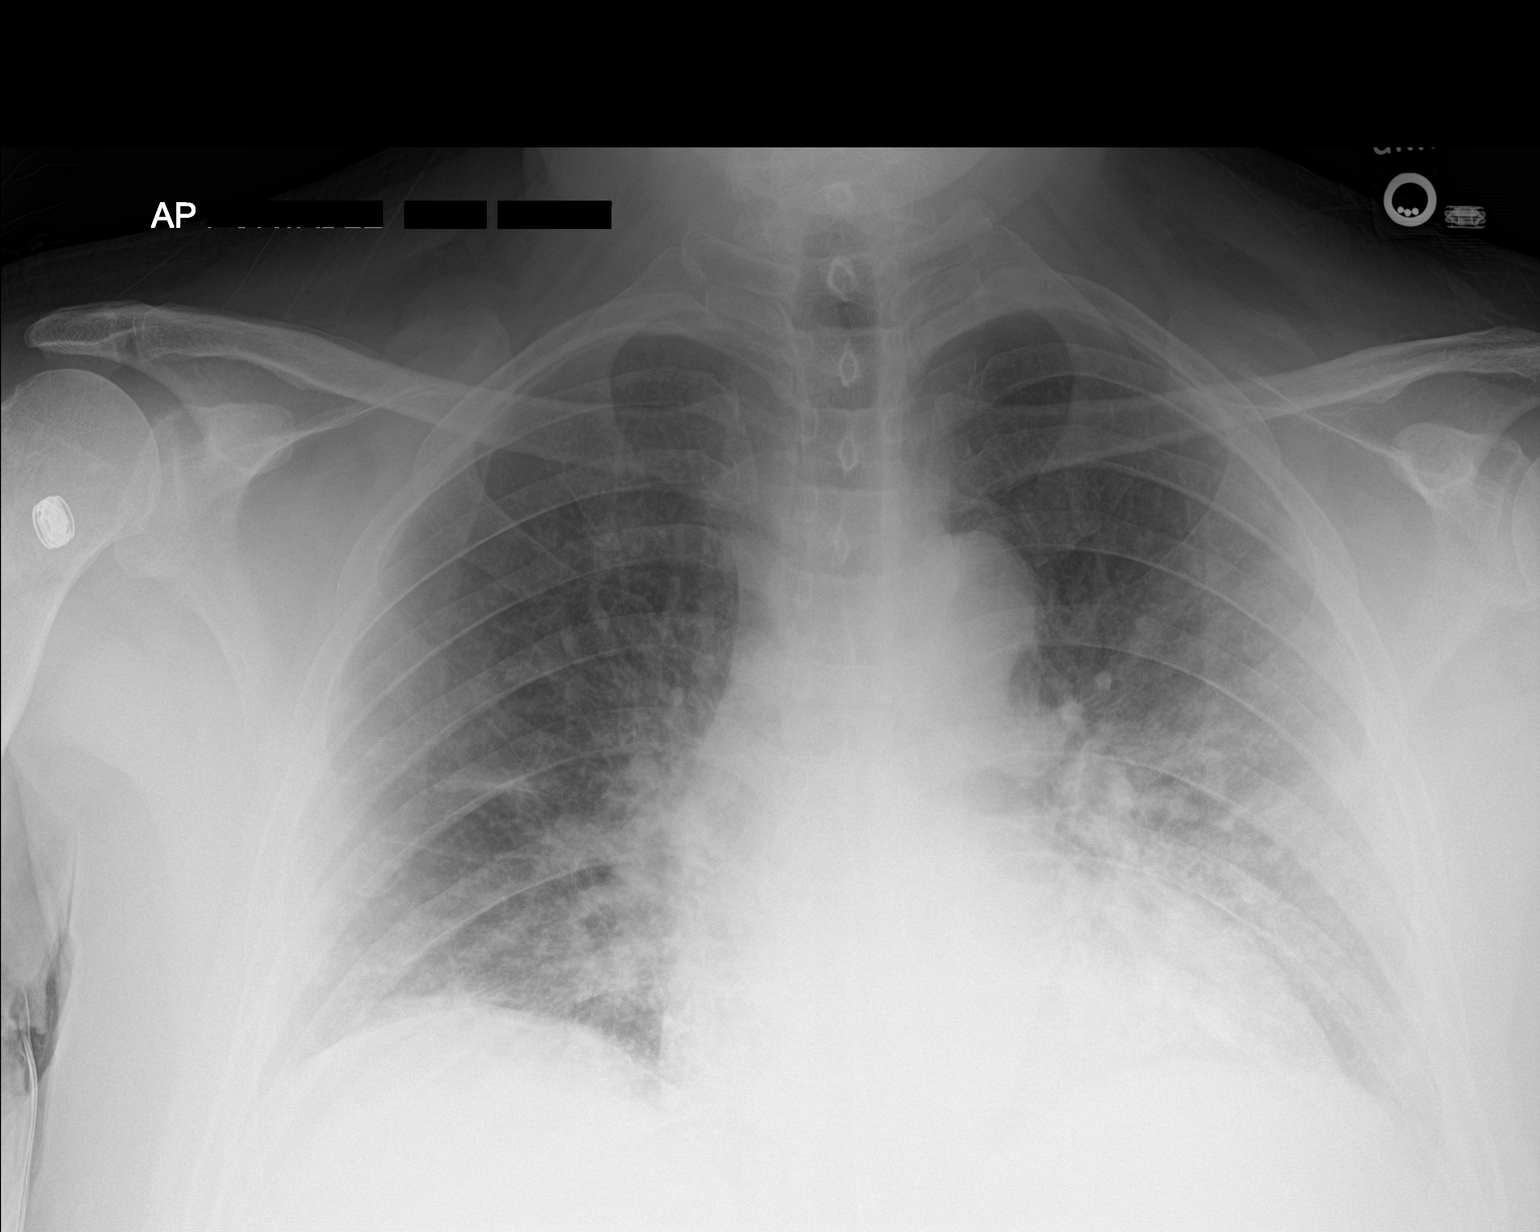

[1 of 1 positions shown; findings below may reference images not displayed]

FINDINGS: 9394 hours. The heart size and mediastinal contours are stable.
There are patchy perihilar and lower lobe airspace opacities, left
greater than right. No pneumothorax or significant pleural effusion
identified. The bones appear unremarkable.
IMPRESSION: Multifocal airspace opacities in both lungs suspicious for
multilobar pneumonia. Followup PA and lateral chest X-ray is
recommended in 3-4 weeks following trial of antibiotic therapy to
ensure resolution and exclude underlying malignancy.

## 2020-04-28 ENCOUNTER — Telehealth: Payer: Self-pay | Admitting: Internal Medicine

## 2020-04-28 DIAGNOSIS — L732 Hidradenitis suppurativa: Secondary | ICD-10-CM | POA: Diagnosis not present

## 2020-04-28 NOTE — Telephone Encounter (Signed)
Medication: cephALEXin (KEFLEX) 500 MG capsule [[id number]]   Has the patient contacted their pharmacy? No. (If no, request that the patient contact the pharmacy for the refill.) (If yes, when and what did the pharmacy advise?)  Preferred Pharmacy (with phone number or street name): [place], [place] - [address]  [address] [place] [address]  Phone:  [phone] Fax:  [phone]  DEA #:  --  Agent: Please be advised that RX refills may take up to 3 business days. We ask that you follow-up with your pharmacy.

## 2020-04-29 NOTE — Telephone Encounter (Signed)
Please advise 

## 2020-04-29 NOTE — Telephone Encounter (Signed)
Unable to refill antibiotics, advised patient if he thinks he has a infection needs to be seen

## 2020-04-30 ENCOUNTER — Ambulatory Visit: Payer: BC Managed Care – PPO | Admitting: Family Medicine

## 2020-04-30 ENCOUNTER — Other Ambulatory Visit: Payer: Self-pay

## 2020-04-30 ENCOUNTER — Encounter: Payer: Self-pay | Admitting: Family Medicine

## 2020-04-30 VITALS — BP 128/70 | HR 75 | Temp 98.4°F | Ht 70.0 in | Wt 244.2 lb

## 2020-04-30 DIAGNOSIS — D367 Benign neoplasm of other specified sites: Secondary | ICD-10-CM | POA: Diagnosis not present

## 2020-04-30 DIAGNOSIS — L03115 Cellulitis of right lower limb: Secondary | ICD-10-CM | POA: Diagnosis not present

## 2020-04-30 MED ORDER — CEPHALEXIN 500 MG PO CAPS: 500.0000 mg | ORAL_CAPSULE | Freq: Four times a day (QID) | ORAL | 0 refills | Status: AC

## 2020-04-30 NOTE — Telephone Encounter (Signed)
Travis Greene or Kennyth Lose- Pt speaks spanish, can you let him know if he needs a refill on antibiotic he will need to be seen first please? Thank you.

## 2020-04-30 NOTE — Patient Instructions (Signed)
If the rash gets worse or if you start having new symptoms, seek care.  If you do not hear anything about your referral in the next 1-2 weeks, call our office and ask for an update.  Let us know if you need anything.

## 2020-04-30 NOTE — Progress Notes (Signed)
Chief Complaint  Patient presents with  . Cyst    Travis Greene is a 54 y.o. male here for a skin complaint. Here w aid of Spanish interpreter.   Duration: 1 year; flared up last week Location: R inner thigh Pruritic? No Painful? Yes Drainage? Some pus New soaps/lotions/topicals/detergents? No Other associated symptoms: no fevers Therapies tried thus far: Keflex 3 mo ago, worked well Pt was referred to gen surg who reportedly did not remove and was going to refer him to someone. They never did and he is requesting being sent to someone to remove.   Past Medical History:  Diagnosis Date  . Chest pain    negative stress test 11/2009  . Colitis   . ED (erectile dysfunction) 11/09  . Hyperthyroidism 12/30/2011  . Ulcerative colitis Dx 2005   [facility], last Cscope 2010 Dr Oletta Lamas, was Rx lialda  . Vision abnormalities     BP 128/70 (BP Location: Left Arm, Patient Position: Sitting, Cuff Size: Large)   Pulse 75   Temp 98.4 F (36.9 C) (Oral)   Ht 5' 10"  (1.778 m)   Wt 244 lb 4 oz (110.8 kg)   SpO2 97%   BMI 35.05 kg/m  Gen: awake, alert, appearing stated age Lungs: No accessory muscle use Skin: prox medial R thigh, there is an area of induration with surrounding erythema, minimal warmth, +TTP. No drainage, fluctuance, excoriation Psych: Age appropriate judgment and insight  Cellulitis of right lower extremity - Plan: cephALEXin (KEFLEX) 500 MG capsule  Dermoid cyst of right lower extremity - Plan: Ambulatory referral to Dermatology  1. Start QID Keflex. No indication for I&D.  2. Refer derm for possible removal. Placed desire for removal in referral note.  F/u prn. The patient thru the interpreter voiced understanding and agreement to the plan.  [place], DO 04/30/20 11:54 AM

## 2020-05-07 DIAGNOSIS — L738 Other specified follicular disorders: Secondary | ICD-10-CM | POA: Diagnosis not present

## 2020-05-07 DIAGNOSIS — L918 Other hypertrophic disorders of the skin: Secondary | ICD-10-CM | POA: Diagnosis not present

## 2020-05-07 DIAGNOSIS — L83 Acanthosis nigricans: Secondary | ICD-10-CM | POA: Diagnosis not present

## 2020-05-21 DIAGNOSIS — K5100 Ulcerative (chronic) pancolitis without complications: Secondary | ICD-10-CM | POA: Diagnosis not present

## 2020-05-29 ENCOUNTER — Other Ambulatory Visit: Payer: Self-pay | Admitting: Internal Medicine

## 2020-06-16 DIAGNOSIS — Z1159 Encounter for screening for other viral diseases: Secondary | ICD-10-CM | POA: Diagnosis not present

## 2020-06-19 DIAGNOSIS — K621 Rectal polyp: Secondary | ICD-10-CM | POA: Diagnosis not present

## 2020-06-19 DIAGNOSIS — K5130 Ulcerative (chronic) rectosigmoiditis without complications: Secondary | ICD-10-CM | POA: Diagnosis not present

## 2020-06-19 DIAGNOSIS — K5140 Inflammatory polyps of colon without complications: Secondary | ICD-10-CM | POA: Diagnosis not present

## 2020-06-19 DIAGNOSIS — K5289 Other specified noninfective gastroenteritis and colitis: Secondary | ICD-10-CM | POA: Diagnosis not present

## 2020-06-19 DIAGNOSIS — K635 Polyp of colon: Secondary | ICD-10-CM | POA: Diagnosis not present

## 2020-06-19 DIAGNOSIS — K6389 Other specified diseases of intestine: Secondary | ICD-10-CM | POA: Diagnosis not present

## 2020-06-19 LAB — HM COLONOSCOPY

## 2020-07-08 ENCOUNTER — Encounter: Payer: Self-pay | Admitting: Internal Medicine

## 2020-07-15 DIAGNOSIS — K5180 Other ulcerative colitis without complications: Secondary | ICD-10-CM | POA: Diagnosis not present

## 2020-07-15 LAB — BASIC METABOLIC PANEL
BUN: 13 (ref 4–21)
CO2: 28 — AB (ref 13–22)
Chloride: 105 (ref 99–108)
Creatinine: 0.8 (ref 0.6–1.3)
Glucose: 91
Potassium: 4.2 (ref 3.4–5.3)
Sodium: 139 (ref 137–147)

## 2020-07-15 LAB — COMPREHENSIVE METABOLIC PANEL
Albumin: 4.1 (ref 3.5–5.0)
Calcium: 9.5 (ref 8.7–10.7)
GFR calc Af Amer: 116
GFR calc non Af Amer: 96

## 2020-07-15 LAB — HEPATIC FUNCTION PANEL
ALT: 22 (ref 10–40)
AST: 16 (ref 14–40)
Alkaline Phosphatase: 57 (ref 25–125)
Bilirubin, Total: 0.7

## 2020-07-15 LAB — VITAMIN D 25 HYDROXY (VIT D DEFICIENCY, FRACTURES): Vit D, 25-Hydroxy: 39.4

## 2020-07-15 LAB — CBC AND DIFFERENTIAL
HCT: 44 (ref 41–53)
Hemoglobin: 15.4 (ref 13.5–17.5)
Platelets: 241 (ref 150–399)
WBC: 6.3

## 2020-07-15 LAB — CBC: RBC: 4.75 (ref 3.87–5.11)

## 2020-08-12 ENCOUNTER — Encounter: Payer: Self-pay | Admitting: Internal Medicine

## 2020-09-08 ENCOUNTER — Other Ambulatory Visit: Payer: Self-pay | Admitting: Internal Medicine

## 2020-09-22 ENCOUNTER — Other Ambulatory Visit: Payer: Self-pay | Admitting: Internal Medicine

## 2020-10-08 ENCOUNTER — Encounter: Payer: Self-pay | Admitting: Internal Medicine

## 2020-12-25 ENCOUNTER — Other Ambulatory Visit: Payer: Self-pay | Admitting: Internal Medicine

## 2021-01-22 ENCOUNTER — Ambulatory Visit: Payer: BC Managed Care – PPO | Attending: Internal Medicine

## 2021-01-22 ENCOUNTER — Ambulatory Visit (INDEPENDENT_AMBULATORY_CARE_PROVIDER_SITE_OTHER): Payer: BC Managed Care – PPO | Admitting: Internal Medicine

## 2021-01-22 ENCOUNTER — Encounter: Payer: Self-pay | Admitting: Internal Medicine

## 2021-01-22 ENCOUNTER — Other Ambulatory Visit: Payer: Self-pay

## 2021-01-22 VITALS — BP 126/84 | HR 63 | Temp 98.0°F | Resp 16 | Ht 68.0 in | Wt 236.5 lb

## 2021-01-22 DIAGNOSIS — R21 Rash and other nonspecific skin eruption: Secondary | ICD-10-CM | POA: Diagnosis not present

## 2021-01-22 DIAGNOSIS — Z23 Encounter for immunization: Secondary | ICD-10-CM | POA: Diagnosis not present

## 2021-01-22 DIAGNOSIS — Z0001 Encounter for general adult medical examination with abnormal findings: Secondary | ICD-10-CM | POA: Diagnosis not present

## 2021-01-22 DIAGNOSIS — E119 Type 2 diabetes mellitus without complications: Secondary | ICD-10-CM | POA: Diagnosis not present

## 2021-01-22 DIAGNOSIS — Z0000 Encounter for general adult medical examination without abnormal findings: Secondary | ICD-10-CM

## 2021-01-22 DIAGNOSIS — D509 Iron deficiency anemia, unspecified: Secondary | ICD-10-CM

## 2021-01-22 DIAGNOSIS — R739 Hyperglycemia, unspecified: Secondary | ICD-10-CM | POA: Diagnosis not present

## 2021-01-22 LAB — IBC + FERRITIN
Ferritin: 107 ng/mL (ref 22.0–322.0)
Iron: 106 ug/dL (ref 42–165)
Saturation Ratios: 36.1 % (ref 20.0–50.0)
Transferrin: 210 mg/dL — ABNORMAL LOW (ref 212.0–360.0)

## 2021-01-22 LAB — LIPID PANEL
Cholesterol: 200 mg/dL (ref 0–200)
HDL: 49.7 mg/dL (ref >39.00)
LDL Cholesterol: 127 mg/dL — ABNORMAL HIGH (ref 0–99)
NonHDL: 150.31
Total CHOL/HDL Ratio: 4
Triglycerides: 116 mg/dL (ref 0.0–149.0)
VLDL: 23.2 mg/dL (ref 0.0–40.0)

## 2021-01-22 LAB — BASIC METABOLIC PANEL
BUN: 20 mg/dL (ref 6–23)
CO2: 25 mEq/L (ref 19–32)
Calcium: 8.7 mg/dL (ref 8.4–10.5)
Chloride: 106 mEq/L (ref 96–112)
Creatinine, Ser: 0.82 mg/dL (ref 0.40–1.50)
GFR: 97.73 mL/min (ref >60.00)
Glucose, Bld: 92 mg/dL (ref 70–99)
Potassium: 3.6 mEq/L (ref 3.5–5.1)
Sodium: 139 mEq/L (ref 135–145)

## 2021-01-22 LAB — HEMOGLOBIN A1C: Hgb A1c MFr Bld: 5.8 % (ref 4.6–6.5)

## 2021-01-22 NOTE — Assessment & Plan Note (Signed)
Here for CPX DM: A1c last year was 6.5, The patient he has diabetes, treatment is diet, exercise, this was extensively discussed.  Recheck A1c today. Hyperlipidemia: In light of diabetes he has hyperlipidemia, checking labs today, explained that he likely will need medication Eczema, rash of the groin: Saw dermatology, will get records, patient reports he was told to continue hydrocortisone as needed Iron deficiency: On supplements, checking labs RTC 3 to 4 months

## 2021-01-22 NOTE — Patient Instructions (Signed)
Check the  blood pressure 2 or 3 times a month week monthly weekly daily    GO TO THE LAB : Get the blood work     GO TO THE FRONT DESK, [place] back for a check up in 3 to 4 months

## 2021-01-22 NOTE — Assessment & Plan Note (Signed)
-  Td 2021 - s/p pfizer covid shot x3, plans to get #4 - shingrix d/w pt: First dose today, next on RTC - rec influenza shot this fall -  PSA 2021 wnl -Cscope 2010, cscope 08/2017: serrated polyp, + chronic active colitis.  Cscope 06-2020>>  melanosis, BX taken.  + Polyps. Next 2 years per pathology report -Diet and exercise discussed extensively -Labs: BMP, FLP, iron panel, A1c.

## 2021-01-22 NOTE — Progress Notes (Signed)
   Covid-19 Vaccination Clinic  Name:  Travis Greene    MRN: [id number] DOB: 05-10-1966  01/22/2021  Mr. Travis Greene was observed post Covid-19 immunization for 15 minutes without incident. He was provided with Vaccine Information Sheet and instruction to access the V-Safe system.   Mr. Travis Greene was instructed to call 911 with any severe reactions post vaccine: Difficulty breathing  Swelling of face and throat  A fast heartbeat  A bad rash all over body  Dizziness and weakness   Immunizations Administered     Name Date Dose VIS Date Route   PFIZER Comrnaty([place]) Covid-19 Vaccine 01/22/2021 10:21 AM 0.3 mL 07/10/2020 Intramuscular   Manufacturer: [place]   Lot: [id number]   [place]: [phone]

## 2021-01-22 NOTE — Progress Notes (Signed)
Subjective:    Patient ID: Travis Greene, male    DOB: 12/13/1965, 55 y.o.   MRN: [id number]  DOS:  01/22/2021 Type of visit - description: Here for CPX Here for CPX Will also talk about diabetes, iron deficiency, a rash, high cholesterol. Overall doing better with diet.  Some weight loss noted.   Review of Systems Doing well, specifically denies any GI symptoms   Other than above, a 14 point review of systems is negative       Past Medical History:  Diagnosis Date   Chest pain    negative stress test 11/2009   Colitis    ED (erectile dysfunction) 11/09   Hyperthyroidism 12/30/2011   Ulcerative colitis Dx 2005   [facility], last Cscope 2010 Dr Oletta Lamas, was Rx lialda   Vision abnormalities     Past Surgical History:  Procedure Laterality Date   APPENDECTOMY     VASECTOMY     Social History   Socioeconomic History   Marital status: Married    Spouse name: Not on file   Number of children: 5   Years of education: Not on file   Highest education level: Not on file  Occupational History   Occupation:  owner [employer]   Tobacco Use   Smoking status: Former    Packs/day: 1.00    Years: 2.00    Pack years: 2.00    Types: Cigarettes    Quit date: 11/06/1997    Years since quitting: 23.2   Smokeless tobacco: Never  Substance and Sexual Activity   Alcohol use: Yes    Comment: socially   Drug use: No    Comment: multiple drugs before, now rarely uses marijuana   Sexual activity: Not on file  Other Topics Concern   Not on file  Social History Narrative   [name], from [place] w/ wife and 3 of his 5 children   Social Determinants of Health   Financial Resource Strain: Not on file  Food Insecurity: Not on file  Transportation Needs: Not on file  Physical Activity: Not on file  Stress: Not on file  Social Connections: Not on file  Intimate Partner Violence: Not on file    Allergies as of 01/22/2021   No Known Allergies      Medication List         Accurate as of January 22, 2021 10:10 PM. If you have any questions, ask your nurse or doctor.          ferrous sulfate 325 (65 FE) MG tablet Take 1 tablet (325 mg total) by mouth 2 (two) times daily with a meal.   hydrocortisone 2.5 % cream Apply topically 2 (two) times daily.           Objective:   Physical Exam BP 126/84 (BP Location: Left Arm, Patient Position: Sitting, Cuff Size: Normal)   Pulse 63   Temp 98 F (36.7 C) (Oral)   Resp 16   Ht 5' 8"  (1.727 m)   Wt 236 lb 8 oz (107.3 kg)   SpO2 95%   BMI 35.96 kg/m  General: Well developed, NAD, BMI noted Neck: No  thyromegaly  HEENT:  Normocephalic . Face symmetric, atraumatic Lungs:  CTA B Normal respiratory effort, no intercostal retractions, no accessory muscle use. Heart: RRR,  no murmur.  Abdomen:  Not distended, soft, non-tender. No rebound or rigidity.   Lower extremities: no pretibial edema bilaterally  Skin: Groin skin: dark and thick,  no boils.. Neurologic:  alert & oriented X3.  Speech normal, gait appropriate for age and unassisted Strength symmetric and appropriate for age.  Psych: Cognition and judgment appear intact.  Cooperative with normal attention span and concentration.  Behavior appropriate. No anxious or depressed appearing.     Assessment     Assessment DM ( A1c  6.05 January 2020) Ulcerative colitis, DX 2005, Dr. Oletta Lamas, [place] at some point H/o ED Eczema - feet, rash groin Vit d def dx 11-2015 Chest pain  (-)  stress test 2011 H/o hyperthyroidism Illiterate  PLAN Here for CPX, we also addressed: DM: A1c last year was 6.5, The patient he has diabetes, treatment is diet, exercise, this was extensively discussed.  Recheck A1c today. Hyperlipidemia: In light of diabetes he has hyperlipidemia, checking labs today, explained that he likely will need medication Eczema, rash of the groin: Saw dermatology, will get records, patient reports he was told to  continue hydrocortisone as needed Iron deficiency: On supplements, checking labs RTC 3 to 4 months    This visit occurred during the SARS-CoV-2 public health emergency.  Safety protocols were in place, including screening questions prior to the visit, additional usage of staff PPE, and extensive cleaning of exam room while observing appropriate contact time as indicated for disinfecting solutions.

## 2021-01-23 ENCOUNTER — Other Ambulatory Visit (HOSPITAL_BASED_OUTPATIENT_CLINIC_OR_DEPARTMENT_OTHER): Payer: Self-pay

## 2021-01-23 MED ORDER — COVID-19 MRNA VAC-TRIS(PFIZER) 30 MCG/0.3ML IM SUSP
INTRAMUSCULAR | 0 refills | Status: DC
  Filled 2021-01-23: qty 0.3, 1d supply, fill #0

## 2021-01-27 ENCOUNTER — Other Ambulatory Visit: Payer: Self-pay

## 2021-01-27 MED ORDER — ATORVASTATIN CALCIUM 20 MG PO TABS: 20.0000 mg | ORAL_TABLET | Freq: Every day | ORAL | 0 refills | Status: DC

## 2021-01-27 NOTE — Progress Notes (Signed)
Rx sent 

## 2021-04-15 ENCOUNTER — Other Ambulatory Visit: Payer: Self-pay | Admitting: Internal Medicine

## 2021-04-24 ENCOUNTER — Ambulatory Visit: Payer: BC Managed Care – PPO | Admitting: Internal Medicine

## 2021-05-04 ENCOUNTER — Ambulatory Visit (INDEPENDENT_AMBULATORY_CARE_PROVIDER_SITE_OTHER): Payer: BC Managed Care – PPO | Admitting: Internal Medicine

## 2021-05-04 ENCOUNTER — Other Ambulatory Visit: Payer: Self-pay

## 2021-05-04 ENCOUNTER — Encounter: Payer: Self-pay | Admitting: Internal Medicine

## 2021-05-04 VITALS — BP 132/70 | HR 66 | Temp 98.0°F | Resp 16 | Ht 68.0 in | Wt 242.1 lb

## 2021-05-04 DIAGNOSIS — Z23 Encounter for immunization: Secondary | ICD-10-CM

## 2021-05-04 DIAGNOSIS — E119 Type 2 diabetes mellitus without complications: Secondary | ICD-10-CM | POA: Diagnosis not present

## 2021-05-04 DIAGNOSIS — N529 Male erectile dysfunction, unspecified: Secondary | ICD-10-CM | POA: Diagnosis not present

## 2021-05-04 DIAGNOSIS — E785 Hyperlipidemia, unspecified: Secondary | ICD-10-CM

## 2021-05-04 DIAGNOSIS — E611 Iron deficiency: Secondary | ICD-10-CM

## 2021-05-04 LAB — MICROALBUMIN / CREATININE URINE RATIO
Creatinine,U: 108.2 mg/dL
Microalb Creat Ratio: 0.6 mg/g (ref 0.0–30.0)
Microalb, Ur: <0.7 mg/dL (ref 0.0–1.9)

## 2021-05-04 LAB — AST: AST: 21 U/L (ref 0–37)

## 2021-05-04 LAB — HEMOGLOBIN A1C: Hgb A1c MFr Bld: 5.9 % (ref 4.6–6.5)

## 2021-05-04 LAB — ALT: ALT: 34 U/L (ref 0–53)

## 2021-05-04 MED ORDER — SILDENAFIL CITRATE 20 MG PO TABS: 60.0000 mg | ORAL_TABLET | Freq: Every evening | ORAL | 3 refills | Status: DC | PRN

## 2021-05-04 NOTE — Patient Instructions (Addendum)
We have placed a referral to an eye doctor for your diabetic eye exam. Please expect a call in the next several days to schedule an appointment.    GO TO THE LAB : Get the blood work     GO TO THE FRONT DESK, PLEASE SCHEDULE YOUR APPOINTMENTS Come back for a checkup in 6  months   Sildenafil 20 mg: Take 3 or 4 tablets 30 minutes before sexual activity. Watch for side effects

## 2021-05-04 NOTE — Progress Notes (Signed)
Subjective:    Patient ID: Travis Greene, male    DOB: [birth year], 55 y.o.   MRN: [id number]  DOS:  05/04/2021 Type of visit - description: f/u  Since the last office visit is doing well. Denies any lower extremity paresthesias Started Lipitor, no apparent side effects.   Review of Systems See above   Past Medical History:  Diagnosis Date   Chest pain    negative stress test 11/2009   Colitis    ED (erectile dysfunction) 11/09   Hyperthyroidism 12/30/2011   Ulcerative colitis Dx 2005   [facility], last Cscope 2010 Dr Oletta Lamas, was Rx lialda   Vision abnormalities     Past Surgical History:  Procedure Laterality Date   APPENDECTOMY     VASECTOMY      Allergies as of 05/04/2021   No Known Allergies      Medication List        Accurate as of May 04, 2021  4:58 PM. If you have any questions, ask your nurse or doctor.          STOP taking these medications    Pfizer-BioNT COVID-19 Vac-TriS Susp injection Generic drug: COVID-19 mRNA Vac-TriS Therapist, music) Stopped by: Kathlene November, MD       TAKE these medications    atorvastatin 20 MG tablet Commonly known as: LIPITOR Take 1 tablet (20 mg total) by mouth daily.   ferrous sulfate 325 (65 FE) MG tablet Take 1 tablet (325 mg total) by mouth 2 (two) times daily with a meal.   hydrocortisone 2.5 % cream Apply topically TWICE DAILY   sildenafil 20 MG tablet Commonly known as: REVATIO Take 3-4 tablets (60-80 mg total) by mouth at bedtime as needed. Started by: Kathlene November, MD           Objective:   Physical Exam BP 132/70 (BP Location: Left Arm, Patient Position: Sitting, Cuff Size: Normal)   Pulse 66   Temp 98 F (36.7 C) (Oral)   Resp 16   Ht 5' 8"  (1.727 m)   Wt 242 lb 2 oz (109.8 kg)   SpO2 98%   BMI 36.81 kg/m  General:   Well developed, NAD, BMI noted. HEENT:  Normocephalic . Face symmetric, atraumatic DM foot exam: No edema, good pedal pulses, pinprick examination normal Skin: Not pale.  Not jaundice Neurologic:  alert & oriented X3.  Speech normal, gait appropriate for age and unassisted Psych--  Cognition and judgment appear intact.  Cooperative with normal attention span and concentration.  Behavior appropriate. No anxious or depressed appearing.      Assessment    Assessment DM ( A1c  6.05 January 2020) Ulcerative colitis, DX 2005, Dr. Oletta Lamas, [place] at some point H/o ED Eczema - feet, rash groin Vit d def dx 11-2015 Chest pain  (-)  stress test 2011 H/o hyperthyroidism Illiterate  PLAN DM: Diet controlled, last A1c very good, discussed diet.  Foot exam negative, check A1c and micro. Dyslipidemia: Started Lipitor few months ago, ran out 3 days ago, check labs, RF with results. Iron deficiency: Last labs very good, was rec to stop iron supplements but he likes to continue taking 1 tablet daily.  That is okay. Vaccines: - Shingrix No. 2 today -Flu shot today -Up-to-date on COVID vaccines (purple top X3, gray top x1) ED: At the end of the visit he also request something for ED, this is going on for a while, quality of erections is poor. In  the past he tried sildenafil without side effects, prescription sent. RTC 6 months  Time spent 30 minutes, including evaluation for diabetes, high cholesterol, advising on mild iron deficiency and reevaluating a inactive problem (erectile dysfunction)  This visit occurred during the SARS-CoV-2 public health emergency.  Safety protocols were in place, including screening questions prior to the visit, additional usage of staff PPE, and extensive cleaning of exam room while observing appropriate contact time as indicated for disinfecting solutions.

## 2021-05-04 NOTE — Assessment & Plan Note (Signed)
DM: Diet controlled, last A1c very good, discussed diet.  Foot exam negative, check A1c and micro. Dyslipidemia: Started Lipitor few months ago, ran out 3 days ago, check labs, RF with results. Iron deficiency: Last labs very good, was rec to stop iron supplements but he likes to continue taking 1 tablet daily.  That is okay. Vaccines: - Shingrix No. 2 today -Flu shot today -Up-to-date on COVID vaccines (purple top X3, gray top x1) ED: At the end of the visit he also request something for ED, this is going on for a while, quality of erections is poor. In the past he tried sildenafil without side effects, prescription sent. RTC 6 months

## 2021-05-05 ENCOUNTER — Other Ambulatory Visit (INDEPENDENT_AMBULATORY_CARE_PROVIDER_SITE_OTHER): Payer: BC Managed Care – PPO

## 2021-05-05 DIAGNOSIS — E119 Type 2 diabetes mellitus without complications: Secondary | ICD-10-CM | POA: Diagnosis not present

## 2021-05-05 LAB — LIPID PANEL
Cholesterol: 152 mg/dL (ref 0–200)
HDL: 53.5 mg/dL (ref >39.00)
LDL Cholesterol: 79 mg/dL (ref 0–99)
NonHDL: 98.06
Total CHOL/HDL Ratio: 3
Triglycerides: 95 mg/dL (ref 0.0–149.0)
VLDL: 19 mg/dL (ref 0.0–40.0)

## 2021-05-07 MED ORDER — ATORVASTATIN CALCIUM 20 MG PO TABS: 20.0000 mg | ORAL_TABLET | Freq: Every day | ORAL | 1 refills | Status: DC

## 2021-05-07 NOTE — Addendum Note (Signed)
Addended byDamita Dunnings D on: 05/07/2021 09:45 AM   Modules accepted: Orders

## 2021-05-19 ENCOUNTER — Ambulatory Visit: Payer: BC Managed Care – PPO | Admitting: Family Medicine

## 2021-05-19 ENCOUNTER — Other Ambulatory Visit: Payer: Self-pay

## 2021-05-19 ENCOUNTER — Encounter: Payer: Self-pay | Admitting: Family Medicine

## 2021-05-19 VITALS — BP 120/88 | HR 79 | Temp 98.3°F | Ht 72.0 in | Wt 244.2 lb

## 2021-05-19 DIAGNOSIS — H1132 Conjunctival hemorrhage, left eye: Secondary | ICD-10-CM

## 2021-05-19 NOTE — Progress Notes (Signed)
Chief Complaint  Patient presents with   problem with left eye redness    Travis Greene is here for left eye irritation.  Duration: 4 days; pt noticed blood under his inner L eye. No trauma, pain, vision changes, difficulty moving eye, sneezing, coughing, straining. It is not spreading out.   Past Medical History:  Diagnosis Date   Chest pain    negative stress test 11/2009   Colitis    ED (erectile dysfunction) 11/09   Hyperthyroidism 12/30/2011   Ulcerative colitis Dx 2005   [facility], last Cscope 2010 Dr Oletta Lamas, was Rx lialda   Vision abnormalities    Family History  Problem Relation Age of Onset   Diabetes Mother    Hypertension Mother        ?   Coronary artery disease Neg Hx    Prostate cancer Neg Hx    Colon cancer Neg Hx     BP 120/88   Pulse 79   Temp 98.3 F (36.8 C) (Oral)   Ht 6' (1.829 m)   Wt 244 lb 4 oz (110.8 kg)   SpO2 97%   BMI 33.13 kg/m  Gen: Awake, alert, appears stated age Eyes: Blood noted along sclera medially. PERRLA. No pain with pressure over closed globes b/l. EOMi. Neg fluoro exam under black light. Lungs: No access musc use.  Psych: Age appropriate judgment and insight; mood and affect normal  Subconjunctival hemorrhage of left eye  Reassurance. No signs of corneal abrasion. Should resolve in 3-4 weeks.  F/u prn.  Pt voiced understanding and agreement to the plan.  [place], DO 05/19/21 2:27 PM

## 2021-05-19 NOTE — Patient Instructions (Signed)
We don't need to do anything with this. It can take a few weeks to slowly improve.  Things to look out for: Fevers, drainage from eye, pain, vision changes.  Let us know if you need anything.

## 2021-06-09 ENCOUNTER — Telehealth: Payer: Self-pay | Admitting: Internal Medicine

## 2021-06-09 NOTE — Telephone Encounter (Signed)
Medication: hydrocortisone 2.5 % cream   Has the patient contacted their pharmacy? Yes.   (If no, request that the patient contact the pharmacy for the refill.) (If yes, when and what did the pharmacy advise?)  Preferred Pharmacy (with phone number or street name): [place], [place] - [address]  [address] [place] [address]  Phone:  [phone]  Fax:  [phone]   Agent: Please be advised that RX refills may take up to 3 business days. We ask that you follow-up with your pharmacy.

## 2021-06-10 MED ORDER — HYDROCORTISONE 2.5 % EX CREA: TOPICAL_CREAM | Freq: Two times a day (BID) | CUTANEOUS | 3 refills | Status: DC

## 2021-06-10 NOTE — Telephone Encounter (Signed)
Rx sent 

## 2021-06-24 ENCOUNTER — Other Ambulatory Visit: Payer: Self-pay | Admitting: Internal Medicine

## 2021-08-03 ENCOUNTER — Other Ambulatory Visit: Payer: Self-pay | Admitting: Internal Medicine

## 2021-09-16 ENCOUNTER — Other Ambulatory Visit: Payer: Self-pay | Admitting: Internal Medicine

## 2021-09-22 ENCOUNTER — Encounter: Payer: Self-pay | Admitting: Internal Medicine

## 2021-10-14 DIAGNOSIS — K2100 Gastro-esophageal reflux disease with esophagitis, without bleeding: Secondary | ICD-10-CM | POA: Insufficient documentation

## 2021-10-22 ENCOUNTER — Ambulatory Visit: Payer: BC Managed Care – PPO | Attending: Internal Medicine

## 2021-10-22 DIAGNOSIS — Z23 Encounter for immunization: Secondary | ICD-10-CM

## 2021-10-22 NOTE — Progress Notes (Signed)
? ?  [facility] ? ?Name:  Travis Greene    ?MRN: [id number] ?DOB: 1965/08/30 ? ?10/22/2021 ? ?[name] was observed post Covid-19 immunization for 15 minutes without incident. He was provided with Vaccine Information Sheet and instruction to access the V-Safe system.  ? ?[name] was instructed to call 911 with any severe reactions post vaccine: ?Difficulty breathing  ?Swelling of face and throat  ?A fast heartbeat  ?A bad rash all over body  ?Dizziness and weakness  ? ?Immunizations Administered   ? ? Name Date Dose VIS Date Route  ? Ambulance person Booster 10/22/2021 10:48 AM 0.3 mL 04/01/2021 Intramuscular  ? Manufacturer: [place]: [id number]  ? [place]: [phone]  ? ?  ?  ?

## 2021-10-29 ENCOUNTER — Other Ambulatory Visit (HOSPITAL_BASED_OUTPATIENT_CLINIC_OR_DEPARTMENT_OTHER): Payer: Self-pay

## 2021-10-29 MED ORDER — PFIZER COVID-19 VAC BIVALENT 30 MCG/0.3ML IM SUSP
INTRAMUSCULAR | 0 refills | Status: DC
  Filled 2021-10-29: qty 0.3, 1d supply, fill #0

## 2021-12-11 ENCOUNTER — Other Ambulatory Visit: Payer: Self-pay

## 2021-12-11 MED ORDER — HYDROCORTISONE 2.5 % EX CREA: TOPICAL_CREAM | Freq: Two times a day (BID) | CUTANEOUS | 3 refills | Status: DC

## 2022-01-12 ENCOUNTER — Other Ambulatory Visit: Payer: Self-pay | Admitting: Internal Medicine

## 2022-01-28 ENCOUNTER — Other Ambulatory Visit: Payer: Self-pay | Admitting: Internal Medicine

## 2022-02-01 ENCOUNTER — Other Ambulatory Visit: Payer: Self-pay

## 2022-02-03 ENCOUNTER — Encounter: Payer: Self-pay | Admitting: Internal Medicine

## 2022-02-03 ENCOUNTER — Ambulatory Visit (INDEPENDENT_AMBULATORY_CARE_PROVIDER_SITE_OTHER): Payer: BC Managed Care – PPO | Admitting: Internal Medicine

## 2022-02-03 VITALS — BP 128/86 | HR 70 | Temp 98.0°F | Ht 70.5 in | Wt 234.8 lb

## 2022-02-03 DIAGNOSIS — N529 Male erectile dysfunction, unspecified: Secondary | ICD-10-CM | POA: Diagnosis not present

## 2022-02-03 DIAGNOSIS — Z0000 Encounter for general adult medical examination without abnormal findings: Secondary | ICD-10-CM | POA: Diagnosis not present

## 2022-02-03 DIAGNOSIS — E119 Type 2 diabetes mellitus without complications: Secondary | ICD-10-CM | POA: Diagnosis not present

## 2022-02-03 DIAGNOSIS — G4730 Sleep apnea, unspecified: Secondary | ICD-10-CM

## 2022-02-03 DIAGNOSIS — E785 Hyperlipidemia, unspecified: Secondary | ICD-10-CM

## 2022-02-03 DIAGNOSIS — Z125 Encounter for screening for malignant neoplasm of prostate: Secondary | ICD-10-CM | POA: Diagnosis not present

## 2022-02-03 LAB — LIPID PANEL
Cholesterol: 188 mg/dL (ref 0–200)
HDL: 50.1 mg/dL (ref >39.00)
LDL Cholesterol: 108 mg/dL — ABNORMAL HIGH (ref 0–99)
NonHDL: 137.82
Total CHOL/HDL Ratio: 4
Triglycerides: 148 mg/dL (ref 0.0–149.0)
VLDL: 29.6 mg/dL (ref 0.0–40.0)

## 2022-02-03 LAB — CBC WITH DIFFERENTIAL/PLATELET
Basophils Absolute: 0 10*3/uL (ref 0.0–0.1)
Basophils Relative: 0.5 % (ref 0.0–3.0)
Eosinophils Absolute: 0.2 10*3/uL (ref 0.0–0.7)
Eosinophils Relative: 3 % (ref 0.0–5.0)
HCT: 43.7 % (ref 39.0–52.0)
Hemoglobin: 15.1 g/dL (ref 13.0–17.0)
Lymphocytes Relative: 45.1 % (ref 12.0–46.0)
Lymphs Abs: 2.5 10*3/uL (ref 0.7–4.0)
MCHC: 34.5 g/dL (ref 30.0–36.0)
MCV: 93.7 fl (ref 78.0–100.0)
Monocytes Absolute: 0.6 10*3/uL (ref 0.1–1.0)
Monocytes Relative: 10.8 % (ref 3.0–12.0)
Neutro Abs: 2.2 10*3/uL (ref 1.4–7.7)
Neutrophils Relative %: 40.6 % — ABNORMAL LOW (ref 43.0–77.0)
Platelets: 237 10*3/uL (ref 150.0–400.0)
RBC: 4.66 Mil/uL (ref 4.22–5.81)
RDW: 12.2 % (ref 11.5–15.5)
WBC: 5.5 10*3/uL (ref 4.0–10.5)

## 2022-02-03 LAB — MICROALBUMIN / CREATININE URINE RATIO
Creatinine,U: 217.2 mg/dL
Microalb Creat Ratio: 0.8 mg/g (ref 0.0–30.0)
Microalb, Ur: 1.8 mg/dL (ref 0.0–1.9)

## 2022-02-03 LAB — COMPREHENSIVE METABOLIC PANEL
ALT: 19 U/L (ref 0–53)
AST: 15 U/L (ref 0–37)
Albumin: 4.3 g/dL (ref 3.5–5.2)
Alkaline Phosphatase: 51 U/L (ref 39–117)
BUN: 15 mg/dL (ref 6–23)
CO2: 24 mEq/L (ref 19–32)
Calcium: 8.9 mg/dL (ref 8.4–10.5)
Chloride: 105 mEq/L (ref 96–112)
Creatinine, Ser: 0.88 mg/dL (ref 0.40–1.50)
GFR: 94.97 mL/min (ref >60.00)
Glucose, Bld: 103 mg/dL — ABNORMAL HIGH (ref 70–99)
Potassium: 3.6 mEq/L (ref 3.5–5.1)
Sodium: 139 mEq/L (ref 135–145)
Total Bilirubin: 0.7 mg/dL (ref 0.2–1.2)
Total Protein: 6.6 g/dL (ref 6.0–8.3)

## 2022-02-03 LAB — HEMOGLOBIN A1C: Hgb A1c MFr Bld: 5.9 % (ref 4.6–6.5)

## 2022-02-03 LAB — TSH: TSH: 0.6 u[IU]/mL (ref 0.35–5.50)

## 2022-02-03 LAB — PSA: PSA: 0.43 ng/mL (ref 0.10–4.00)

## 2022-02-03 LAB — FERRITIN: Ferritin: 67.7 ng/mL (ref 22.0–322.0)

## 2022-02-03 LAB — IRON: Iron: 152 ug/dL (ref 42–165)

## 2022-02-03 MED ORDER — SILDENAFIL CITRATE 20 MG PO TABS: ORAL_TABLET | ORAL | 6 refills | Status: DC

## 2022-02-03 NOTE — Progress Notes (Unsigned)
Subjective:    Patient ID: Travis Greene, male    DOB: 08/12/1965, 56 y.o.   MRN: [id number]  DOS:  02/03/2022 Type of visit - description: cpx  Since the last office visit is doing well. Patient reports today that his wife has noticed very loud snoring episode of apnea. The patient reports no major problems with feeling sleepy throughout the day and he is very active.   Review of Systems See above   Past Medical History:  Diagnosis Date   Chest pain    negative stress test 11/2009   Colitis    ED (erectile dysfunction) 11/09   Hyperthyroidism 12/30/2011   Ulcerative colitis Dx 2005   [facility], last Cscope 2010 Dr Oletta Lamas, was Rx lialda   Vision abnormalities     Past Surgical History:  Procedure Laterality Date   APPENDECTOMY     VASECTOMY      Current Outpatient Medications  Medication Instructions   atorvastatin (LIPITOR) 20 MG tablet TAKE ONE TABLET BY MOUTH EVERY DAY   FEROSUL 325 (65 Fe) MG tablet TAKE ONE TABLET BY MOUTH TWICE DAILY WITH MEALS   hydrocortisone 2.5 % cream APPLY TO THE AFFECTED AREA TWICE DAILY   sildenafil (REVATIO) 20 MG tablet TAKE 3 TO 4 BY MOUTH AT BEDTIME AS NEEDED       Objective:   Physical Exam BP 128/86 (BP Location: Left Arm, Cuff Size: Large)   Pulse 70   Temp 98 F (36.7 C) (Oral)   Ht 5' 10.5" (1.791 m)   Wt 234 lb 12.8 oz (106.5 kg)   SpO2 97%   BMI 33.21 kg/m  General: Well developed, NAD, BMI noted Neck: No  thyromegaly  HEENT:  Normocephalic . Face symmetric, atraumatic Lungs:  CTA B Normal respiratory effort, no intercostal retractions, no accessory muscle use. Heart: RRR,  no murmur.  Abdomen:  Not distended, soft, non-tender. No rebound or rigidity.   DM foot exam: No edema, good pedal pulses, pinprick examination normal Skin: Exposed areas without rash. Not pale. Not jaundice DRE: Normal sphincter tone, no stools found, unable to reach the prostate Neurologic:  alert & oriented X3.  Speech normal, gait  appropriate for age and unassisted Strength symmetric and appropriate for age.  Psych: Cognition and judgment appear intact.  Cooperative with normal attention span and concentration.  Behavior appropriate. No anxious or depressed appearing.     Assessment     Assessment DM ( A1c  6.05 January 2020) Ulcerative colitis, DX 2005, Dr. Oletta Lamas, [place] at some point H/o ED Eczema - feet, rash groin Vit d def dx 11-2015 Chest pain  (-)  stress test 2011 H/o hyperthyroidism Illiterate  PLAN Here for CPX DM: Diet controlled, check A1c, micro.  Feet exam negative. Dyslipidemia: On atorvastatin.  Labs UC: No symptoms at this point ED: Good response to Viagra, refill sent Iron supplements: Still taking iron.  Checking levels. OSA: Patient with severe snoring and witnessed apneas, referred to neurology RTC 6 months   -Td 2021 - Covid vax: UTD - s/p shingrix   - rec influenza shot this fall -Prostate cancer screening: Unable to reach today prostate exam, check PSA.  No symptoms -Cscope 2010, cscope 08/2017: serrated polyp, + chronic active colitis.  Cscope 06-2020>>  melanosis, BX taken.  + Polyps. Next 06-2022, patient aware.  -Diet and exercise discussed extensively -Labs: CMP,.FLP, CBC, A1c, PSA, iron, ferritin, TSH     10 DM: Diet controlled, last A1c  very good, discussed diet.  Foot exam negative, check A1c and micro. Dyslipidemia: Started Lipitor few months ago, ran out 3 days ago, check labs, RF with results. Iron deficiency: Last labs very good, was rec to stop iron supplements but he likes to continue taking 1 tablet daily.  That is okay. Vaccines: - Shingrix No. 2 today -Flu shot today -Up-to-date on COVID vaccines (purple top X3, gray top x1) ED: At the end of the visit he also request something for ED, this is going on for a while, quality of erections is poor. In the past he tried sildenafil without side effects, prescription sent. RTC 6 months

## 2022-02-03 NOTE — Patient Instructions (Addendum)
You are due for your next colonoscopy by November.  If the gastroenterologist does not call you in the next few months please let me know.  We are referring you to the neurology office for possible sleep apnea.  (Snoring).  GO TO THE LAB : Get the blood work     [place], [place] back for   a checkup in 6 months

## 2022-02-04 ENCOUNTER — Encounter: Payer: Self-pay | Admitting: Internal Medicine

## 2022-02-04 NOTE — Assessment & Plan Note (Signed)
-  Td 2021 - Covid vax: UTD - s/p shingrix   - rec influenza shot this fall -Prostate cancer screening: Unable to reach today prostate exam, check PSA.  No symptoms -CCS: *Cscope 2010, cscope 08/2017: serrated polyp, + chronic active colitis.  *Cscope 06-2020>>  melanosis, BX taken.  + Polyps. Next 06-2022, patient aware.  -Diet and exercise discussed extensively -Labs: CMP,.FLP, CBC, A1c, PSA, iron, ferritin, TSH

## 2022-02-04 NOTE — Assessment & Plan Note (Signed)
Here for CPX DM: Diet controlled, check A1c, micro.  Feet exam negative. Dyslipidemia: On atorvastatin.  Labs UC: No symptoms at this point ED: Good response to Viagra, refill sent Iron supplements: Still taking iron.  Checking levels. OSA: Patient with severe snoring and witnessed apneas, referred to neurology RTC 6 months

## 2022-03-22 ENCOUNTER — Institutional Professional Consult (permissible substitution): Payer: BC Managed Care – PPO | Admitting: Neurology

## 2022-03-23 ENCOUNTER — Encounter: Payer: Self-pay | Admitting: Internal Medicine

## 2022-04-22 ENCOUNTER — Other Ambulatory Visit: Payer: Self-pay | Admitting: Internal Medicine

## 2022-05-05 DIAGNOSIS — K219 Gastro-esophageal reflux disease without esophagitis: Secondary | ICD-10-CM | POA: Diagnosis not present

## 2022-05-05 DIAGNOSIS — K5180 Other ulcerative colitis without complications: Secondary | ICD-10-CM | POA: Diagnosis not present

## 2022-05-24 ENCOUNTER — Other Ambulatory Visit: Payer: Self-pay | Admitting: Internal Medicine

## 2022-06-01 DIAGNOSIS — R899 Unspecified abnormal finding in specimens from other organs, systems and tissues: Secondary | ICD-10-CM | POA: Diagnosis not present

## 2022-07-20 ENCOUNTER — Other Ambulatory Visit: Payer: Self-pay | Admitting: Internal Medicine

## 2022-08-06 ENCOUNTER — Ambulatory Visit: Payer: BC Managed Care – PPO | Admitting: Internal Medicine

## 2022-08-09 ENCOUNTER — Encounter: Payer: Self-pay | Admitting: Internal Medicine

## 2022-08-09 ENCOUNTER — Ambulatory Visit (INDEPENDENT_AMBULATORY_CARE_PROVIDER_SITE_OTHER): Payer: BC Managed Care – PPO | Admitting: Internal Medicine

## 2022-08-09 VITALS — BP 122/84 | HR 66 | Temp 97.6°F | Resp 18 | Ht 70.5 in | Wt 247.2 lb

## 2022-08-09 DIAGNOSIS — E119 Type 2 diabetes mellitus without complications: Secondary | ICD-10-CM | POA: Diagnosis not present

## 2022-08-09 DIAGNOSIS — L989 Disorder of the skin and subcutaneous tissue, unspecified: Secondary | ICD-10-CM

## 2022-08-09 DIAGNOSIS — E785 Hyperlipidemia, unspecified: Secondary | ICD-10-CM

## 2022-08-09 LAB — LIPID PANEL
Cholesterol: 212 mg/dL — ABNORMAL HIGH (ref 0–200)
HDL: 54.8 mg/dL (ref >39.00)
LDL Cholesterol: 130 mg/dL — ABNORMAL HIGH (ref 0–99)
NonHDL: 157.36
Total CHOL/HDL Ratio: 4
Triglycerides: 135 mg/dL (ref 0.0–149.0)
VLDL: 27 mg/dL (ref 0.0–40.0)

## 2022-08-09 LAB — BASIC METABOLIC PANEL
BUN: 15 mg/dL (ref 6–23)
CO2: 28 mEq/L (ref 19–32)
Calcium: 8.7 mg/dL (ref 8.4–10.5)
Chloride: 102 mEq/L (ref 96–112)
Creatinine, Ser: 0.82 mg/dL (ref 0.40–1.50)
GFR: 96.67 mL/min (ref >60.00)
Glucose, Bld: 105 mg/dL — ABNORMAL HIGH (ref 70–99)
Potassium: 4.5 mEq/L (ref 3.5–5.1)
Sodium: 137 mEq/L (ref 135–145)

## 2022-08-09 LAB — HEMOGLOBIN A1C: Hgb A1c MFr Bld: 6 % (ref 4.6–6.5)

## 2022-08-09 NOTE — Patient Instructions (Addendum)
Vaccines I recommend:  Covid booster  Check the  blood pressure regularly BP GOAL is between 110/65 and  135/85. If it is consistently higher or lower, let me know     GO TO THE LAB : Get the blood work     [place], Travis Greene Come back for   physical exam in 6 months    Aparentemente ya es tiempo de hacerse un examen de los ojos. Por favor llame a su doctor de los ojos (ophthalmologist, optometrist) y saque una cita. Solicite que nos envien una copia de la visita a nuestro fax: [phone]. Si necesitara un "referral" nosotros lo podemos hacer

## 2022-08-09 NOTE — Progress Notes (Signed)
   Subjective:    Patient ID: Travis Greene, male    DOB: [birth year], 57 y.o.   MRN: [id number]  DOS:  08/09/2022 Type of visit - description: Follow-up  Since the last office visit is doing well. We talk about his chronic medical problems For few  months has noted a skin lesion on the left forehead. Denies any nausea vomiting or diarrhea. No blood in the stools. Noted blood from the tip of the tongue a couple of times, denies any injury or growth  Review of Systems See above   Past Medical History:  Diagnosis Date   Chest pain    negative stress test 11/2009   Colitis    ED (erectile dysfunction) 11/09   Hyperthyroidism 12/30/2011   Ulcerative colitis Dx 2005   [facility], last Cscope 2010 Dr Oletta Lamas, was Rx lialda   Vision abnormalities     Past Surgical History:  Procedure Laterality Date   APPENDECTOMY     VASECTOMY      Current Outpatient Medications  Medication Instructions   atorvastatin (LIPITOR) 20 mg, Oral, Daily   FEROSUL 325 (65 Fe) MG tablet TAKE ONE TABLET BY MOUTH TWICE DAILY WITH MEALS   hydrocortisone 2.5 % cream APPLY TO THE AFFECTED AREA TWICE DAILY   sildenafil (REVATIO) 20 MG tablet TAKE 3 TO 4 TABLETS BY MOUTH AT BEDTIME as needed       Objective:   Physical Exam BP 122/84   Pulse 66   Temp 97.6 F (36.4 C) (Oral)   Resp 18   Ht 5' 10.5" (1.791 m)   Wt 247 lb 4 oz (112.2 kg)   SpO2 98%   BMI 34.98 kg/m  General:   Well developed, NAD, BMI noted. HEENT:  Normocephalic . Face symmetric, atraumatic. Inspection and palpation of the tongue: Normal Lungs:  CTA B Normal respiratory effort, no intercostal retractions, no accessory muscle use. Heart: RRR,  no murmur.  Lower extremities: no pretibial edema bilaterally  Skin: At the L forehead/scalp has a 1 cm x 1 cm irregular border hyperpigmented, flat area with small area that is darker. Neurologic:  alert & oriented X3.  Speech normal, gait appropriate for age and unassisted Psych--   Cognition and judgment appear intact.  Cooperative with normal attention span and concentration.  Behavior appropriate. No anxious or depressed appearing.      Assessment    Assessment DM ( A1c  6.05 January 2020) Dyslipidemia Ulcerative colitis, DX 2005, Dr. Oletta Lamas, [place] at some point H/o ED Eczema - feet, rash groin Vit d def dx 11-2015 Chest pain  (-)  stress test 2011 H/o hyperthyroidism Illiterate  PLAN DM: Diet controlled, last A1c excellent.  Recheck A1c.  Also BMP. Dyslipidemia: On atorvastatin, LDL was 103, has been as good at 79.  Recheck FLP Skin lesion, bi color: SK?  Refer to Derm Ulcerative colitis: Reportedly saw GI 2023, will get records Vaccines: Had a flu shot, recommend COVID-vaccine. RTC 6 months CPX

## 2022-08-09 NOTE — Assessment & Plan Note (Signed)
DM: Diet controlled, last A1c excellent.  Recheck A1c.  Also BMP. Dyslipidemia: On atorvastatin, LDL was 103, has been as good at 79.  Recheck FLP Skin lesion, bi color: SK?  Refer to Derm Ulcerative colitis: Reportedly saw GI 2023, will get records Vaccines: Had a flu shot, recommend COVID-vaccine. RTC 6 months CPX

## 2022-08-11 MED ORDER — ATORVASTATIN CALCIUM 40 MG PO TABS: 40.0000 mg | ORAL_TABLET | Freq: Every day | ORAL | 1 refills | Status: DC

## 2022-08-11 NOTE — Addendum Note (Signed)
Addended byDamita Dunnings D on: 08/11/2022 04:11 PM   Modules accepted: Orders

## 2022-08-12 ENCOUNTER — Ambulatory Visit: Payer: BC Managed Care – PPO | Admitting: Internal Medicine

## 2022-08-12 DIAGNOSIS — D485 Neoplasm of uncertain behavior of skin: Secondary | ICD-10-CM | POA: Diagnosis not present

## 2022-08-12 DIAGNOSIS — D0439 Carcinoma in situ of skin of other parts of face: Secondary | ICD-10-CM | POA: Diagnosis not present

## 2022-08-12 DIAGNOSIS — L820 Inflamed seborrheic keratosis: Secondary | ICD-10-CM | POA: Diagnosis not present

## 2022-08-12 DIAGNOSIS — L815 Leukoderma, not elsewhere classified: Secondary | ICD-10-CM | POA: Diagnosis not present

## 2022-08-24 ENCOUNTER — Other Ambulatory Visit: Payer: Self-pay | Admitting: Internal Medicine

## 2022-10-26 ENCOUNTER — Other Ambulatory Visit: Payer: Self-pay | Admitting: Internal Medicine

## 2022-11-09 ENCOUNTER — Encounter: Payer: Self-pay | Admitting: Internal Medicine

## 2023-01-26 DIAGNOSIS — H43392 Other vitreous opacities, left eye: Secondary | ICD-10-CM | POA: Diagnosis not present

## 2023-02-07 ENCOUNTER — Encounter: Payer: BC Managed Care – PPO | Admitting: Internal Medicine

## 2023-02-23 DIAGNOSIS — H524 Presbyopia: Secondary | ICD-10-CM | POA: Diagnosis not present

## 2023-02-23 DIAGNOSIS — H5203 Hypermetropia, bilateral: Secondary | ICD-10-CM | POA: Diagnosis not present

## 2023-02-23 DIAGNOSIS — H25813 Combined forms of age-related cataract, bilateral: Secondary | ICD-10-CM | POA: Diagnosis not present

## 2023-02-23 DIAGNOSIS — H43812 Vitreous degeneration, left eye: Secondary | ICD-10-CM | POA: Diagnosis not present

## 2023-03-02 ENCOUNTER — Other Ambulatory Visit: Payer: Self-pay | Admitting: Internal Medicine

## 2023-03-23 ENCOUNTER — Encounter: Payer: Self-pay | Admitting: Internal Medicine

## 2023-03-28 DIAGNOSIS — K5180 Other ulcerative colitis without complications: Secondary | ICD-10-CM | POA: Diagnosis not present

## 2023-03-28 DIAGNOSIS — K219 Gastro-esophageal reflux disease without esophagitis: Secondary | ICD-10-CM | POA: Diagnosis not present

## 2023-03-28 DIAGNOSIS — K625 Hemorrhage of anus and rectum: Secondary | ICD-10-CM | POA: Diagnosis not present

## 2023-03-28 LAB — CBC AND DIFFERENTIAL
HCT: 45 (ref 41–53)
Hemoglobin: 15.2 (ref 13.5–17.5)
Platelets: 242 10*3/uL (ref 150–400)
WBC: 6.5

## 2023-03-28 LAB — COMPREHENSIVE METABOLIC PANEL
Albumin: 4.3 (ref 3.5–5.0)
Calcium: 9.1 (ref 8.7–10.7)
eGFR: 101

## 2023-03-28 LAB — BASIC METABOLIC PANEL
BUN: 15 (ref 4–21)
CO2: 24 — AB (ref 13–22)
Chloride: 105 (ref 99–108)
Creatinine: 0.8 (ref 0.6–1.3)
Glucose: 128
Potassium: 4.2 mEq/L (ref 3.5–5.1)
Sodium: 137 (ref 137–147)

## 2023-03-28 LAB — HEPATIC FUNCTION PANEL
ALT: 25 U/L (ref 10–40)
AST: 15 (ref 14–40)
Alkaline Phosphatase: 71 (ref 25–125)
Bilirubin, Total: 0.6

## 2023-03-28 LAB — CBC: RBC: 4.78 (ref 3.87–5.11)

## 2023-03-30 ENCOUNTER — Ambulatory Visit (INDEPENDENT_AMBULATORY_CARE_PROVIDER_SITE_OTHER): Payer: BC Managed Care – PPO | Admitting: Internal Medicine

## 2023-03-30 ENCOUNTER — Encounter: Payer: Self-pay | Admitting: Internal Medicine

## 2023-03-30 VITALS — BP 128/72 | HR 68 | Temp 98.2°F | Resp 16 | Ht 70.5 in | Wt 240.1 lb

## 2023-03-30 DIAGNOSIS — E559 Vitamin D deficiency, unspecified: Secondary | ICD-10-CM

## 2023-03-30 DIAGNOSIS — E785 Hyperlipidemia, unspecified: Secondary | ICD-10-CM

## 2023-03-30 DIAGNOSIS — Z0000 Encounter for general adult medical examination without abnormal findings: Secondary | ICD-10-CM

## 2023-03-30 DIAGNOSIS — Z0001 Encounter for general adult medical examination with abnormal findings: Secondary | ICD-10-CM

## 2023-03-30 DIAGNOSIS — E119 Type 2 diabetes mellitus without complications: Secondary | ICD-10-CM

## 2023-03-30 DIAGNOSIS — R0683 Snoring: Secondary | ICD-10-CM

## 2023-03-30 DIAGNOSIS — Z0189 Encounter for other specified special examinations: Secondary | ICD-10-CM

## 2023-03-30 LAB — HEMOGLOBIN A1C: Hgb A1c MFr Bld: 6 % (ref 4.6–6.5)

## 2023-03-30 LAB — MICROALBUMIN / CREATININE URINE RATIO
Creatinine,U: 133.5 mg/dL
Microalb Creat Ratio: 0.6 mg/g (ref 0.0–30.0)
Microalb, Ur: 0.8 mg/dL (ref 0.0–1.9)

## 2023-03-30 LAB — LIPID PANEL
Cholesterol: 179 mg/dL (ref 0–200)
HDL: 54 mg/dL (ref >39.00)
LDL Cholesterol: 108 mg/dL — ABNORMAL HIGH (ref 0–99)
NonHDL: 125.12
Total CHOL/HDL Ratio: 3
Triglycerides: 86 mg/dL (ref 0.0–149.0)
VLDL: 17.2 mg/dL (ref 0.0–40.0)

## 2023-03-30 NOTE — Assessment & Plan Note (Signed)
Here for CPX -Td 2021 - s/p shingrix   - Recommend: flu shot and covid booster this fall  -Prostate cancer screening: no sxs, no FH check PSA.   -CCS: *Cscope 2010, cscope 08/2017: serrated polyp, + chronic active colitis.  *Cscope 06-2020>>  melanosis, BX ulcerative colitis, polyps benign. -Diet and exercise discussed. -Labs from 03/28/2023: Hemoglobin 15.2.  Platelet count 242.  Creatinine 0.8.  Sodium 137.  Potassium 4.2.  AST ALT normal. Will get the following: FLP A1c micro vitamin D PSA

## 2023-03-30 NOTE — Assessment & Plan Note (Signed)
Here for CPX DM: Diet controlled, check A1c, micro.  Feet exam negative. Dyslipidemia: Based on last FLP, Lipitor increased to 40 mg, reports good compliance.  Check FLP. Vit d Def: not on supplements, check labs  Ulcerative colitis Recent rectal bleeding, saw GI, labs obtained, Rx colonoscopy next month. Aspirin for primary prevention?  Currently having some rectal bleeding, will avoid aspirin for now.  Reassess periodically. Snoring: Heavy snoring, witnessed apneas, previous referral to neurology failure.  Will try again.  Instruction provided in Spanish. RTC 6 months

## 2023-03-30 NOTE — Progress Notes (Signed)
Subjective:    Patient ID: Travis Greene, male    DOB: 10-09-1965, 57 y.o.   MRN: [id number]  DOS:  03/30/2023 Type of visit - description: Here for CPX Here for CPX Chronic medical problems addressed. Had a minor amount of rectal bleeding, saw GI, scheduled for a colonoscopy. Reports fatigue, no chest pain or difficulty breathing. Again he admits to loud snoring and episodes of apnea witnessed by his wife.  Review of Systems  Other than above, a 14 point review of systems is negative      Past Medical History:  Diagnosis Date   Chest pain    negative stress test 11/2009   Colitis    ED (erectile dysfunction) 11/09   Hyperthyroidism 12/30/2011   Ulcerative colitis Dx 2005   [facility], last Cscope 2010 Dr Randa Evens, was Rx lialda   Vision abnormalities     Past Surgical History:  Procedure Laterality Date   APPENDECTOMY     VASECTOMY     Social History   Socioeconomic History   Marital status: Married    Spouse name: Not on file   Number of children: 5   Years of education: Not on file   Highest education level: Not on file  Occupational History   Occupation:  owner boot store   Tobacco Use   Smoking status: Former    Current packs/day: 0.00    Average packs/day: 1 pack/day for 2.0 years (2.0 ttl pk-yrs)    Types: Cigarettes    Start date: 11/07/1995    Quit date: 11/06/1997    Years since quitting: 25.4   Smokeless tobacco: Never   Tobacco comments:    Quit tobacco 1999  Substance and Sexual Activity   Alcohol use: Yes    Comment: socially   Drug use: No    Comment: multiple drugs before, now rarely uses marijuana   Sexual activity: Not on file  Other Topics Concern   Not on file  Social History Narrative   [place], from [place]   Lives w/ wife and 3 of his 5 children   Social Determinants of Health   Financial Resource Strain: Not on file  Food Insecurity: Not on file  Transportation Needs: Not on file  Physical Activity: Not on file  Stress: Not  on file  Social Connections: Not on file  Intimate Partner Violence: Not on file      Current Outpatient Medications  Medication Instructions   atorvastatin (LIPITOR) 40 mg, Oral, Daily at bedtime   esomeprazole (NEXIUM) 40 mg, Oral, Daily   ferrous sulfate (FEROSUL) 325 mg, Oral, 2 times daily with meals   hydrocortisone 2.5 % cream Topical, 2 times daily   Lialda 1.2 g, Oral, Daily with breakfast   sildenafil (REVATIO) 20 MG tablet TAKE 3 TO 4 TABLETS AT BEDTIME as needed       Objective:   Physical Exam BP 128/72   Pulse 68   Temp 98.2 F (36.8 C) (Oral)   Resp 16   Ht 5' 10.5" (1.791 m)   Wt 240 lb 2 oz (108.9 kg)   SpO2 97%   BMI 33.97 kg/m  General: Well developed, NAD, BMI noted Neck: No  thyromegaly  HEENT:  Normocephalic . Face symmetric, atraumatic Lungs:  CTA B Normal respiratory effort, no intercostal retractions, no accessory muscle use. Heart: RRR,  no murmur.  Abdomen:  Not distended, soft, non-tender. No rebound or rigidity.   DM foot exam: No edema, good pedal pulses, pinprick examination  normal Skin: Exposed areas without rash. Not pale. Not jaundice Neurologic:  alert & oriented X3.  Speech normal, gait appropriate for age and unassisted Strength symmetric and appropriate for age.  Psych: Cognition and judgment appear intact.  Cooperative with normal attention span and concentration.  Behavior appropriate. No anxious or depressed appearing.     Assessment     Assessment DM ( A1c  6.05 January 2020) Dyslipidemia Ulcerative colitis, DX 2005, Dr. Randa Evens, Cscope  2010,used  Lialda at some point H/o ED Eczema - feet, rash groin Vit d def dx 11-2015 Chest pain  (-)  stress test 2011 H/o hyperthyroidism Illiterate  PLAN Here for CPX -Td 2021 - s/p shingrix   - Recommend: flu shot and covid booster this fall  -Prostate cancer screening: no sxs, no FH check PSA.   -CCS: *Cscope 2010, cscope 08/2017: serrated polyp, + chronic active  colitis.  *Cscope 06-2020>>  melanosis, BX ulcerative colitis, polyps benign. -Diet and exercise discussed. -Labs from 03/28/2023: Hemoglobin 15.2.  Platelet count 242.  Creatinine 0.8.  Sodium 137.  Potassium 4.2.  AST ALT normal. Will get the following: FLP A1c micro vitamin D PSA DM: Diet controlled, check A1c, micro.  Feet exam negative. Dyslipidemia: Based on last FLP, Lipitor increased to 40 mg, reports good compliance.  Check FLP. Vit d Def: not on supplements, check labs  Ulcerative colitis Recent rectal bleeding, saw GI, labs obtained, Rx colonoscopy next month. Aspirin for primary prevention?  Currently having some rectal bleeding, will avoid aspirin for now.  Reassess periodically. Snoring: Heavy snoring, witnessed apneas, previous referral to neurology failure.  Will try again.  Instruction provided in Spanish. RTC 6 months

## 2023-03-30 NOTE — Patient Instructions (Addendum)
Vaccines I recommend: Covid booster- new this fall Flu shot this fall  Call the neurology office with interpreter, you need an appointment with them.  You probably have sleep apnea.   LLAME AL NEUROLOGO CON UN INTERPRETE,  [phone] NECESITA UNA CITA CON ELLOS . PROBABLEMENTE TIENE APNEA DE SUEN~O     GO TO THE LAB : Get the blood work     GO TO THE FRONT DESK, PLEASE SCHEDULE YOUR APPOINTMENTS Come back for   a checkup in 6 months   Aparentemente ya es tiempo de hacerse un examen de los ojos. Por favor llame a su doctor de los ojos (ophthalmologist, optometrist) y saque una cita. Solicite que nos envien una copia de la visita a nuestro fax: [phone]. Si necesitara un "referral" nosotros lo podemos hacer

## 2023-03-31 LAB — PSA: PSA: 0.41 ng/mL (ref 0.10–4.00)

## 2023-03-31 LAB — VITAMIN D 25 HYDROXY (VIT D DEFICIENCY, FRACTURES): VITD: 25.01 ng/mL — ABNORMAL LOW (ref 30.00–100.00)

## 2023-04-05 MED ORDER — VITAMIN D (ERGOCALCIFEROL) 1.25 MG (50000 UNIT) PO CAPS: 50000.0000 [IU] | ORAL_CAPSULE | ORAL | 0 refills | Status: AC

## 2023-04-05 MED ORDER — ATORVASTATIN CALCIUM 80 MG PO TABS: 80.0000 mg | ORAL_TABLET | Freq: Every day | ORAL | 1 refills | Status: DC

## 2023-04-05 NOTE — Addendum Note (Signed)
Addended byConrad La Villita D on: 04/05/2023 01:17 PM   Modules accepted: Orders

## 2023-04-20 DIAGNOSIS — K5289 Other specified noninfective gastroenteritis and colitis: Secondary | ICD-10-CM | POA: Diagnosis not present

## 2023-04-20 DIAGNOSIS — K635 Polyp of colon: Secondary | ICD-10-CM | POA: Diagnosis not present

## 2023-04-20 DIAGNOSIS — K5100 Ulcerative (chronic) pancolitis without complications: Secondary | ICD-10-CM | POA: Diagnosis not present

## 2023-04-20 LAB — HM COLONOSCOPY

## 2023-05-04 ENCOUNTER — Other Ambulatory Visit: Payer: Self-pay | Admitting: Internal Medicine

## 2023-05-10 ENCOUNTER — Encounter: Payer: Self-pay | Admitting: Neurology

## 2023-05-10 ENCOUNTER — Ambulatory Visit (INDEPENDENT_AMBULATORY_CARE_PROVIDER_SITE_OTHER): Payer: BC Managed Care – PPO | Admitting: Neurology

## 2023-05-10 VITALS — BP 132/91 | HR 66 | Ht 70.0 in | Wt 242.0 lb

## 2023-05-10 DIAGNOSIS — Z9189 Other specified personal risk factors, not elsewhere classified: Secondary | ICD-10-CM

## 2023-05-10 DIAGNOSIS — R0683 Snoring: Secondary | ICD-10-CM

## 2023-05-10 DIAGNOSIS — R519 Headache, unspecified: Secondary | ICD-10-CM

## 2023-05-10 DIAGNOSIS — R5383 Other fatigue: Secondary | ICD-10-CM | POA: Diagnosis not present

## 2023-05-10 DIAGNOSIS — R0681 Apnea, not elsewhere classified: Secondary | ICD-10-CM

## 2023-05-10 DIAGNOSIS — E66811 Obesity, class 1: Secondary | ICD-10-CM

## 2023-05-10 DIAGNOSIS — R351 Nocturia: Secondary | ICD-10-CM

## 2023-05-10 NOTE — Patient Instructions (Signed)

## 2023-05-10 NOTE — Progress Notes (Signed)
Subjective:    Patient ID: Travis Greene is a 57 y.o. male.  HPI    Huston Foley, MD, PhD [facility] [address] [address] [place], [place] [address]  Dear Dr. Drue Novel,  I saw your patient, Travis Greene, upon your kind request in my sleep clinic today for initial consultation of his sleep disorder, in particular, concern for underlying obstructive sleep apnea.  The patient is accompanied by a Spanish interpreter today.  As you know, Travis Greene is a 57 year old male with an underlying medical history of ulcerative colitis, hypothyroidism, chest pain, hyperlipidemia, vitamin D deficiency, diet-controlled diabetes and obesity, who reports snoring and excessive daytime somnolence, as well as witnessed apneas per wife's report.  His Epworth sleepiness score is 4 out of 24, fatigue severity score is 63 out of 63.  I reviewed your office note from 03/30/2023.  He lives with his wife and 3 children, 1 daughter and 2 sons.  They have no pets at the house, he does have a TV in the bedroom and they tend to watch it for about 45 minutes to an hour before falling asleep.  Bedtime is generally around midnight and rise time around 8 or 9 AM.  He runs his own business, has a store.  He drinks caffeine in the form of coffee and tea, about 2-3 servings altogether per day.  He has not smoked in over 30 years.  He drinks no alcohol currently, has had none in 5 years, previously reportedly was a heavy drinker by his self-report.  His weight has been more or less stable for the past 15 years.  He has been encouraged to work on weight loss he reports.  He has nocturia about once or twice per average night and has occasionally woken up with a headache.  He does not typically take any medication for his headaches and tries to ride it out.  He is not aware of any family history of sleep apnea.  His Past Medical History Is Significant For: Past Medical History:  Diagnosis Date   Chest  pain    negative stress test 11/2009   Colitis    ED (erectile dysfunction) 11/09   Hyperthyroidism 12/30/2011   Ulcerative colitis Dx 2005   [facility], last Cscope 2010 Dr Randa Evens, was Rx lialda   Vision abnormalities     His Past Surgical History Is Significant For: Past Surgical History:  Procedure Laterality Date   APPENDECTOMY     VASECTOMY      His Family History Is Significant For: Family History  Problem Relation Age of Onset   Diabetes Mother    Hypertension Mother        ?   Coronary artery disease Neg Hx    Prostate cancer Neg Hx    Colon cancer Neg Hx    Sleep apnea Neg Hx     His Social History Is Significant For: Social History   Socioeconomic History   Marital status: Married    Spouse name: Not on file   Number of children: 5   Years of education: Not on file   Highest education level: Not on file  Occupational History   Occupation:  owner boot store   Tobacco Use   Smoking status: Former    Current packs/day: 0.00    Average packs/day: 1 pack/day for 2.0 years (2.0 ttl pk-yrs)    Types: Cigarettes    Start date: 11/07/1995    Quit date: 11/06/1997  Years since quitting: 25.5   Smokeless tobacco: Never   Tobacco comments:    Quit tobacco 1999  Substance and Sexual Activity   Alcohol use: Yes    Comment: socially   Drug use: No    Comment: multiple drugs before, now rarely uses marijuana   Sexual activity: Not on file  Other Topics Concern   Not on file  Social History Narrative   [place], from [place]   Lives w/ wife and 3 of his 5 children   Social Determinants of Health   Financial Resource Strain: Not on file  Food Insecurity: Not on file  Transportation Needs: Not on file  Physical Activity: Not on file  Stress: Not on file  Social Connections: Not on file    His Allergies Are:  No Known Allergies:   His Current Medications Are:  Outpatient Encounter Medications as of 05/10/2023  Medication Sig   atorvastatin (LIPITOR) 80  MG tablet Take 1 tablet (80 mg total) by mouth at bedtime.   esomeprazole (NEXIUM) 40 MG capsule Take 40 mg by mouth daily.   ferrous sulfate (FEROSUL) 325 (65 FE) MG tablet Take 1 tablet (325 mg total) by mouth 2 (two) times daily with a meal.   hydrocortisone 2.5 % cream Apply topically 2 (two) times daily. (Patient taking differently: Apply topically as needed.)   sildenafil (REVATIO) 20 MG tablet TAKE 3 TO 4 TABLETS AT BEDTIME as needed   Vitamin D, Ergocalciferol, (DRISDOL) 1.25 MG (50000 UNIT) CAPS capsule Take 1 capsule (50,000 Units total) by mouth every 7 (seven) days.   LIALDA 1.2 g EC tablet Take 1.2 g by mouth daily with breakfast.   No facility-administered encounter medications on file as of 05/10/2023.  :   Review of Systems:  Out of a complete 14 point review of systems, all are reviewed and negative with the exception of these symptoms as listed below:   Review of Systems  Neurological:        Pt here for sleep consult  Pt states fatigue,snore,headaches, witness apnea Pt denies hypertension,sleep study,CPAP machine   ESS: FSS:    Objective:  Neurological Exam  Physical Exam Physical Examination:   Vitals:   05/10/23 1118  BP: (!) 132/91  Pulse: 66    General Examination: The patient is a very pleasant 57 y.o. male in no acute distress. He appears well-developed and well-nourished and well groomed.   HEENT: Normocephalic, atraumatic, pupils are equal, round and reactive to light, extraocular tracking is good without limitation to gaze excursion or nystagmus noted. Hearing is grossly intact. Face is symmetric with normal facial animation. Speech is clear with no dysarthria noted. There is no hypophonia. There is no lip, neck/head, jaw or voice tremor. Neck is supple with full range of passive and active motion. There are no carotid bruits on auscultation. Oropharynx exam reveals: mild mouth dryness, adequate dental hygiene and moderate airway crowding, due to small  airway entry and Mallampati class IV.  Uvula tip and tonsils not fully visualized.  Tongue protrudes centrally and palate elevates symmetrically for the visible portions.  Neck circumference 18-3/8 inches, minimal to mild overbite noted.  Chest: Clear to auscultation without wheezing, rhonchi or crackles noted.  Heart: S1+S2+0, regular and normal without murmurs, rubs or gallops noted.   Abdomen: Soft, non-tender and non-distended.  Extremities: There is no pitting edema in the distal lower extremities bilaterally.   Skin: Warm and dry without trophic changes noted.   Musculoskeletal: exam reveals no obvious  joint deformities.   Neurologically:  Mental status: The patient is awake, alert and oriented in all 4 spheres. His immediate and remote memory, attention, language skills and fund of knowledge are appropriate. There is no evidence of aphasia, agnosia, apraxia or anomia. Speech is clear with normal prosody and enunciation. Thought process is linear. Mood is normal and affect is normal.  Cranial nerves II - XII are as described above under HEENT exam.  Motor exam: Normal bulk, strength and tone is noted. There is no obvious action or resting tremor.  Fine motor skills and coordination: grossly intact.  Cerebellar testing: No dysmetria or intention tremor. There is no truncal or gait ataxia.  Sensory exam: intact to light touch in the upper and lower extremities.  Gait, station and balance: He stands easily. No veering to one side is noted. No leaning to one side is noted. Posture is age-appropriate and stance is narrow based. Gait shows normal stride length and normal pace. No problems turning are noted.   Assessment and Plan:  In summary, Travis Greene is a very pleasant 57 y.o.-year old male with an underlying medical history of ulcerative colitis, hypothyroidism, chest pain, hyperlipidemia, vitamin D deficiency, diet-controlled diabetes and obesity, whose history and physical exam are  concerning for sleep disordered breathing, particularly obstructive sleep apnea (OSA).  While a laboratory attended sleep study is typically considered "gold standard" for evaluation of sleep disordered breathing, we mutually agreed to proceed with a home sleep test at this time.   I had a long chat with the patient about my findings and the diagnosis of sleep apnea, particularly OSA, its prognosis and treatment options. We talked about medical/conservative treatments, surgical interventions and non-pharmacological approaches for symptom control. I explained, in particular, the risks and ramifications of untreated moderate to severe OSA, especially with respect to developing cardiovascular disease down the road, including congestive heart failure (CHF), difficult to treat hypertension, cardiac arrhythmias (particularly A-fib), neurovascular complications including TIA, stroke and dementia. Even type 2 diabetes has, in part, been linked to untreated OSA. Symptoms of untreated OSA may include (but may not be limited to) daytime sleepiness, nocturia (i.e. frequent nighttime urination), memory problems, mood irritability and suboptimally controlled or worsening mood disorder such as depression and/or anxiety, lack of energy, lack of motivation, physical discomfort, as well as recurrent headaches, especially morning or nocturnal headaches. We talked about the importance of maintaining a healthy lifestyle and striving for healthy weight. In addition, we talked about the importance of striving for and maintaining good sleep hygiene. I recommended a sleep study at this time. I outlined the differences between a laboratory attended sleep study which is considered more comprehensive and accurate over the option of a home sleep test (HST); the latter may lead to underestimation of sleep disordered breathing in some instances and does not help with diagnosing upper airway resistance syndrome and is not accurate enough to  diagnose primary central sleep apnea typically. I outlined possible surgical and non-surgical treatment options of OSA, including the use of a positive airway pressure (PAP) device (i.e. CPAP, AutoPAP/APAP or BiPAP in certain circumstances), a custom-made dental device (aka oral appliance, which would require a referral to a specialist dentist or orthodontist typically, and is generally speaking not considered for patients with full dentures or edentulous state), upper airway surgical options, such as traditional UPPP (which is not considered a first-line treatment) or the Inspire device (hypoglossal nerve stimulator, which would involve a referral for consultation with an ENT surgeon, after  careful selection, following inclusion criteria - also not first-line treatment). I explained the PAP treatment option to the patient in detail, as this is generally considered first-line treatment.  The patient indicated that he would be willing to try PAP therapy, if the need arises. I explained the importance of being compliant with PAP treatment, not only for insurance purposes but primarily to improve patient's symptoms symptoms, and for the patient's long term health benefit, including to reduce His cardiovascular risks longer-term.    We will pick up our discussion about the next steps and treatment options after testing.  We will keep him posted as to the test results by phone call and/or MyChart messaging where possible.  We will plan to follow-up in sleep clinic accordingly as well.  I answered all his questions today and the patient was in agreement.   I encouraged him to call with any interim questions, concerns, problems or updates or email [place] through MyChart.  Generally speaking, sleep test authorizations may take up to 2 weeks, sometimes less, sometimes longer, the patient is encouraged to get in touch with [place] if they do not hear back from the sleep lab staff directly within the next 2 weeks.  Thank you  very much for allowing me to participate in the care of this nice patient. If I can be of any further assistance to you please do not hesitate to call me at [phone].  Sincerely,   Huston Foley, MD, PhD

## 2023-06-06 ENCOUNTER — Ambulatory Visit: Payer: BC Managed Care – PPO | Admitting: Neurology

## 2023-06-06 DIAGNOSIS — R519 Headache, unspecified: Secondary | ICD-10-CM

## 2023-06-06 DIAGNOSIS — R0683 Snoring: Secondary | ICD-10-CM

## 2023-06-06 DIAGNOSIS — E66811 Obesity, class 1: Secondary | ICD-10-CM

## 2023-06-06 DIAGNOSIS — R0681 Apnea, not elsewhere classified: Secondary | ICD-10-CM

## 2023-06-06 DIAGNOSIS — Z9189 Other specified personal risk factors, not elsewhere classified: Secondary | ICD-10-CM

## 2023-06-06 DIAGNOSIS — R351 Nocturia: Secondary | ICD-10-CM

## 2023-06-06 DIAGNOSIS — R5383 Other fatigue: Secondary | ICD-10-CM

## 2023-06-17 ENCOUNTER — Encounter: Payer: Self-pay | Admitting: Internal Medicine

## 2023-06-28 ENCOUNTER — Telehealth: Payer: Self-pay

## 2023-06-28 NOTE — Telephone Encounter (Signed)
Patient was mailed disposable home sleep study device on 06/06/23. As of 06/28/23 device has not been used and no contact has been made from the patient. The sleep lab spoke with the patient via telephone on 06/16/23 and pt was aware to use device by 06/22/23. Device has now been disconnected in [employer] portal. Patient may be charged $300 fee for not using device within time frame given per instructions given with home sleep study.

## 2023-07-05 ENCOUNTER — Other Ambulatory Visit: Payer: Self-pay | Admitting: Internal Medicine

## 2023-07-29 ENCOUNTER — Other Ambulatory Visit: Payer: Self-pay | Admitting: Internal Medicine

## 2023-08-16 DIAGNOSIS — M25561 Pain in right knee: Secondary | ICD-10-CM | POA: Diagnosis not present

## 2023-08-16 DIAGNOSIS — M25461 Effusion, right knee: Secondary | ICD-10-CM | POA: Diagnosis not present

## 2023-08-18 ENCOUNTER — Ambulatory Visit (INDEPENDENT_AMBULATORY_CARE_PROVIDER_SITE_OTHER): Payer: BC Managed Care – PPO | Admitting: Physician Assistant

## 2023-08-18 ENCOUNTER — Encounter: Payer: Self-pay | Admitting: Physician Assistant

## 2023-08-18 DIAGNOSIS — M25561 Pain in right knee: Secondary | ICD-10-CM

## 2023-08-18 MED ORDER — METHYLPREDNISOLONE ACETATE 40 MG/ML IJ SUSP
40.0000 mg | INTRAMUSCULAR | Status: AC | PRN
  Administered 2023-08-18: 40 mg via INTRA_ARTICULAR

## 2023-08-18 MED ORDER — LIDOCAINE HCL 1 % IJ SOLN
3.0000 mL | INTRAMUSCULAR | Status: AC | PRN
  Administered 2023-08-18: 3 mL

## 2023-08-18 MED ORDER — BUPIVACAINE HCL 0.25 % IJ SOLN
2.0000 mL | INTRAMUSCULAR | Status: AC | PRN
  Administered 2023-08-18: 2 mL via INTRA_ARTICULAR

## 2023-08-18 NOTE — Progress Notes (Signed)
Office Visit Note   Patient: Travis Greene           Date of Birth: 1966/07/22           MRN: [id number] Visit Date: 08/18/2023              Requested by: Wanda Plump, MD [address] [place],  [place] [address] PCP: Wanda Plump, MD   Assessment & Plan: Visit Diagnoses:  1. Right knee pain, unspecified chronicity     Plan: Pleasant 58 year old gentleman with new onset of right knee pain.  Denies any fever chills or injuries.  X-rays were done at urgent care a couple days ago readings were consistent with some degenerative changes as well as chondrocalcinosis.  Will go forward with aspiration today although was only was able to aspirate about 10 cc.  His exam was fairly benign.  No signs of infection.  Did give him a steroid injection.  Like to see him back in a couple weeks see how he is doing at that time we should get three-view x-rays of his right knee also gave him information about Voltaren  Follow-Up Instructions: No follow-ups on file.   Orders:  No orders of the defined types were placed in this encounter.  No orders of the defined types were placed in this encounter.     Procedures: Large Joint Inj: R knee on 08/18/2023 10:21 AM Indications: pain and diagnostic evaluation Details: 25 G 1.5 in needle, superolateral approach  Arthrogram: No  Medications: 40 mg methylPREDNISolone acetate 40 MG/ML; 3 mL lidocaine 1 %; 2 mL bupivacaine 0.25 % Outcome: tolerated well, no immediate complications  After verbal consent was obtained the superior lateral pouch of his right knee was prepped with alcohol and Betadine.  3 cc of lidocaine plain on a 25-gauge needle were injected.  After adequate analgesia aspiration was attempted only 10 cc of blood-tinged fluid was able to be aspirated.  Then injected 40 mg of Depo-Medrol and 2 cc of bupivacaine Band-Aid applied patient replaced his knee and ambulated from the clinic Procedure, treatment alternatives, risks and  benefits explained, specific risks discussed. Consent was given by the patient.     Clinical Data: No additional findings.   Subjective: Chief Complaint  Patient presents with  . Right Knee - Follow-up    HPI patient presents today with a chief complaint of right knee pain and swelling denies any injury denies any fever or chills went to [facility] they told him he had a lot of fluid on his knee and arthritis.  This was confirmed by the x-ray reading.  He has been taking Aleve and admits the swelling has gone down    Review of Systems  All other systems reviewed and are negative.    Objective: Vital Signs: There were no vitals taken for this visit.  Physical Exam Constitutional:      Appearance: Normal appearance.  Pulmonary:     Effort: Pulmonary effort is normal.  Skin:    General: Skin is warm and dry.  Neurological:     General: No focal deficit present.     Mental Status: He is alert and oriented to person, place, and time.  Psychiatric:        Mood and Affect: Mood normal.        Behavior: Behavior normal.   Ortho Exam Right knee mild effusion neurovascular intact no erythema compartments are soft and compressible strength is intact no  hip pain just globally tender medial laterally bearing weight today just fine assistive device although is wearing the brace Specialty Comments:  No specialty comments available.  Imaging: No results found.   PMFS History: Patient Active Problem List   Diagnosis Date Noted  . Pain in right knee 08/18/2023  . Gastro-esophageal reflux disease with esophagitis 10/14/2021  . Dyslipidemia 05/04/2021  . Type 2 diabetes mellitus without complication, without long-term current use of insulin (HCC) 01/22/2021  . Pneumonia due to COVID-19 virus 12/18/2018  . PCP NOTES >>>>>>>>>>>> 11/27/2015  . Eczema 10/17/2014  . Cauda equina syndrome with neurogenic bladder (HCC) 12/30/2011  . Annual physical exam 12/29/2010  .  Ulcerative colitis (HCC) 01/02/2010  . Anxiety state 06/24/2008  . Erectile dysfunction 06/24/2008   Past Medical History:  Diagnosis Date  . Chest pain    negative stress test 11/2009  . Colitis   . ED (erectile dysfunction) 11/09  . Hyperthyroidism 12/30/2011  . Ulcerative colitis Dx 2005   [facility], last Cscope 2010 Dr Randa Evens, was Rx lialda  . Vision abnormalities     Family History  Problem Relation Age of Onset  . Diabetes Mother   . Hypertension Mother        ?  . Coronary artery disease Neg Hx   . Prostate cancer Neg Hx   . Colon cancer Neg Hx   . Sleep apnea Neg Hx     Past Surgical History:  Procedure Laterality Date  . APPENDECTOMY    . VASECTOMY     Social History   Occupational History  . Occupation:  Database administrator   Tobacco Use  . Smoking status: Former    Current packs/day: 0.00    Average packs/day: 1 pack/day for 2.0 years (2.0 ttl pk-yrs)    Types: Cigarettes    Start date: 11/07/1995    Quit date: 11/06/1997    Years since quitting: 25.7  . Smokeless tobacco: Never  . Tobacco comments:    Quit tobacco 1999  Substance and Sexual Activity  . Alcohol use: Yes    Comment: socially  . Drug use: No    Comment: multiple drugs before, now rarely uses marijuana  . Sexual activity: Not on file

## 2023-08-30 ENCOUNTER — Ambulatory Visit: Payer: BC Managed Care – PPO | Admitting: Physician Assistant

## 2023-08-30 ENCOUNTER — Encounter: Payer: Self-pay | Admitting: Physician Assistant

## 2023-08-30 DIAGNOSIS — M1711 Unilateral primary osteoarthritis, right knee: Secondary | ICD-10-CM

## 2023-08-30 NOTE — Progress Notes (Signed)
Office Visit Note   Patient: Travis Greene           Date of Birth: August 15, 1965           MRN: [id number] Visit Date: 08/30/2023              Requested by: Wanda Plump, MD [address] [place],  [place] [address] PCP: Wanda Plump, MD  Right knee pain    HPI: Patient is a pleasant 58 year old gentleman who follows up for his right knee pain.  I did give him an intra-articular steroid injection a couple weeks ago.  He reports he is doing much better  Assessment & Plan: Visit Diagnoses: Right knee arthritis and chondrocalcinosis  Plan: May follow-up as needed if he wants another injection of also showed him close chain quadricep strengthening exercises can also use Voltaren gel  Follow-Up Instructions: No follow-ups on file.   Ortho Exam  Patient is alert, oriented, no adenopathy, well-dressed, normal affect, normal respiratory effort. Right knee no erythema compartments are soft and compressible he does have some grinding with range of motion but no pain.  He is neurovascularly intact no effusion  Imaging: No results found. No images are attached to the encounter.  Labs: Lab Results  Component Value Date   HGBA1C 6.0 03/30/2023   HGBA1C 6.0 08/09/2022   HGBA1C 5.9 02/03/2022   ESRSEDRATE 13 06/23/2018   ESRSEDRATE 10 01/24/2008   CRP 9.7 (H) 12/20/2018   CRP 9.7 (H) 12/20/2018   CRP 17.5 (H) 12/19/2018   REPTSTATUS 12/23/2018 FINAL 12/18/2018   CULT  12/18/2018    NO GROWTH 5 DAYS Performed at [facility], [address] [place], [place] [address]      Lab Results  Component Value Date   ALBUMIN 4.3 03/28/2023   ALBUMIN 4.3 02/03/2022   ALBUMIN 4.1 07/15/2020    Lab Results  Component Value Date   MG 2.1 12/20/2018   MG 2.0 12/19/2018   Lab Results  Component Value Date   VD25OH 25.01 (L) 03/30/2023   VD25OH 39.4 07/15/2020   VD25OH 39.12 01/21/2020    No results found for: "PREALBUMIN"    Latest Ref Rng & Units 03/28/2023    12:00 AM 02/03/2022   10:38 AM 07/15/2020   12:00 AM  CBC EXTENDED  WBC  6.5     5.5  6.3      RBC 3.87 - 5.11 4.78     4.66  4.75      Hemoglobin 13.5 - 17.5 15.2     15.1  15.4      HCT 41 - 53 45     43.7  44      Platelets 150 - 400 K/uL 242     237.0  241      NEUT# 1.4 - 7.7 K/uL  2.2    Lymph# 0.7 - 4.0 K/uL  2.5       This result is from an external source.     There is no height or weight on file to calculate BMI.  Orders:  No orders of the defined types were placed in this encounter.  No orders of the defined types were placed in this encounter.    Procedures: No procedures performed  Clinical Data: No additional findings.  ROS:  All other systems negative, except as noted in the HPI. Review of Systems  Objective: Vital Signs: There were no vitals taken for this visit.  Specialty Comments:  No specialty comments available.  PMFS History: Patient Active Problem List   Diagnosis Date Noted   Pain in right knee 08/18/2023   Gastro-esophageal reflux disease with esophagitis 10/14/2021   Dyslipidemia 05/04/2021   Type 2 diabetes mellitus without complication, without long-term current use of insulin (HCC) 01/22/2021   Pneumonia due to COVID-19 virus 12/18/2018   PCP NOTES >>>>>>>>>>>> 11/27/2015   Eczema 10/17/2014   Cauda equina syndrome with neurogenic bladder (HCC) 12/30/2011   Annual physical exam 12/29/2010   Ulcerative colitis (HCC) 01/02/2010   Anxiety state 06/24/2008   Erectile dysfunction 06/24/2008   Past Medical History:  Diagnosis Date   Chest pain    negative stress test 11/2009   Colitis    ED (erectile dysfunction) 11/09   Hyperthyroidism 12/30/2011   Ulcerative colitis Dx 2005   [facility], last Cscope 2010 Dr Randa Evens, was Rx lialda   Vision abnormalities     Family History  Problem Relation Age of Onset   Diabetes Mother    Hypertension Mother        ?   Coronary artery disease Neg Hx    Prostate cancer Neg Hx    Colon  cancer Neg Hx    Sleep apnea Neg Hx     Past Surgical History:  Procedure Laterality Date   APPENDECTOMY     VASECTOMY     Social History   Occupational History   Occupation:  Database administrator   Tobacco Use   Smoking status: Former    Current packs/day: 0.00    Average packs/day: 1 pack/day for 2.0 years (2.0 ttl pk-yrs)    Types: Cigarettes    Start date: 11/07/1995    Quit date: 11/06/1997    Years since quitting: 25.8   Smokeless tobacco: Never   Tobacco comments:    Quit tobacco 1999  Substance and Sexual Activity   Alcohol use: Yes    Comment: socially   Drug use: No    Comment: multiple drugs before, now rarely uses marijuana   Sexual activity: Not on file

## 2023-10-03 ENCOUNTER — Encounter: Payer: Self-pay | Admitting: Internal Medicine

## 2023-10-03 ENCOUNTER — Ambulatory Visit (INDEPENDENT_AMBULATORY_CARE_PROVIDER_SITE_OTHER): Payer: BC Managed Care – PPO | Admitting: Internal Medicine

## 2023-10-03 VITALS — BP 134/82 | HR 69 | Temp 97.8°F | Resp 16 | Ht 70.0 in | Wt 254.4 lb

## 2023-10-03 DIAGNOSIS — E119 Type 2 diabetes mellitus without complications: Secondary | ICD-10-CM

## 2023-10-03 DIAGNOSIS — R0683 Snoring: Secondary | ICD-10-CM

## 2023-10-03 DIAGNOSIS — E785 Hyperlipidemia, unspecified: Secondary | ICD-10-CM

## 2023-10-03 DIAGNOSIS — K5100 Ulcerative (chronic) pancolitis without complications: Secondary | ICD-10-CM

## 2023-10-03 DIAGNOSIS — E559 Vitamin D deficiency, unspecified: Secondary | ICD-10-CM | POA: Diagnosis not present

## 2023-10-03 LAB — CBC WITH DIFFERENTIAL/PLATELET
Basophils Absolute: 0.1 10*3/uL (ref 0.0–0.1)
Basophils Relative: 0.8 % (ref 0.0–3.0)
Eosinophils Absolute: 0.3 10*3/uL (ref 0.0–0.7)
Eosinophils Relative: 3.6 % (ref 0.0–5.0)
HCT: 44.7 % (ref 39.0–52.0)
Hemoglobin: 15.4 g/dL (ref 13.0–17.0)
Lymphocytes Relative: 35.6 % (ref 12.0–46.0)
Lymphs Abs: 2.6 10*3/uL (ref 0.7–4.0)
MCHC: 34.4 g/dL (ref 30.0–36.0)
MCV: 95.1 fl (ref 78.0–100.0)
Monocytes Absolute: 0.6 10*3/uL (ref 0.1–1.0)
Monocytes Relative: 8.5 % (ref 3.0–12.0)
Neutro Abs: 3.7 10*3/uL (ref 1.4–7.7)
Neutrophils Relative %: 51.5 % (ref 43.0–77.0)
Platelets: 235 10*3/uL (ref 150.0–400.0)
RBC: 4.7 Mil/uL (ref 4.22–5.81)
RDW: 12.4 % (ref 11.5–15.5)
WBC: 7.2 10*3/uL (ref 4.0–10.5)

## 2023-10-03 LAB — AST: AST: 23 U/L (ref 0–37)

## 2023-10-03 LAB — ALT: ALT: 42 U/L (ref 0–53)

## 2023-10-03 LAB — LIPID PANEL
Cholesterol: 123 mg/dL (ref 0–200)
HDL: 57.4 mg/dL (ref >39.00)
LDL Cholesterol: 46 mg/dL (ref 0–99)
NonHDL: 65.78
Total CHOL/HDL Ratio: 2
Triglycerides: 100 mg/dL (ref 0.0–149.0)
VLDL: 20 mg/dL (ref 0.0–40.0)

## 2023-10-03 LAB — VITAMIN D 25 HYDROXY (VIT D DEFICIENCY, FRACTURES): VITD: 25.06 ng/mL — ABNORMAL LOW (ref 30.00–100.00)

## 2023-10-03 NOTE — Patient Instructions (Addendum)
  Tomese una muestra de sangre antes de irse  Saque una cita para su examen fisico en [place]  Llame al neurologo y hagase la prueba de OSA       GO TO THE LAB : Get the blood work     Please go to the front desk and schedule the following: A physical exam by 03-2024       If you have MyChart, please check frequently in the next few days for your results Aparentemente ya es tiempo de hacerse un examen de los ojos. Por favor llame a su doctor de los ojos (ophthalmologist, optometrist) y saque una cita. Solicite que nos envien una copia de la visita a nuestro fax: [phone]. Si necesitara un "referral" nosotros lo podemos hacer

## 2023-10-03 NOTE — Progress Notes (Unsigned)
 Subjective:    Patient ID: Travis Greene, male    DOB: February 08, 1966, 58 y.o.   MRN: [id number]  DOS:  10/03/2023 Type of visit - description: 44-month follow-up  Chronic medical problems addressed. Had right knee pain, saw ortho, doing well. History of ulcerative colitis: Currently with no nausea vomiting, no diarrhea, no blood in the stools. He did report feeling tired in general but no chest pain or difficulty breathing.   Review of Systems See above   Past Medical History:  Diagnosis Date   Chest pain    negative stress test 11/2009   Colitis    ED (erectile dysfunction) 11/09   Hyperthyroidism 12/30/2011   Ulcerative colitis Dx 2005   [facility], last Cscope 2010 Dr Randa Evens, was Rx lialda   Vision abnormalities     Past Surgical History:  Procedure Laterality Date   APPENDECTOMY     VASECTOMY      Current Outpatient Medications  Medication Instructions   atorvastatin (LIPITOR) 80 mg, Oral, Daily at bedtime   esomeprazole (NEXIUM) 40 mg, Daily   ferrous sulfate (FEROSUL) 325 mg, Oral, 2 times daily with meals   hydrocortisone 2.5 % cream Topical, 2 times daily   Lialda 1.2 g, Daily with breakfast   sildenafil (REVATIO) 20 MG tablet TAKE 3 TO 4 TABLETS AT BEDTIME as needed       Objective:   Physical Exam BP 134/82   Pulse 69   Temp 97.8 F (36.6 C) (Oral)   Resp 16   Ht 5\' 10"  (1.778 m)   Wt 254 lb 6 oz (115.4 kg)   SpO2 95%   BMI 36.50 kg/m  General:   Well developed, NAD, BMI noted. HEENT:  Normocephalic . Face symmetric, atraumatic Lungs:  CTA B Normal respiratory effort, no intercostal retractions, no accessory muscle use. Heart: RRR,  no murmur.  Lower extremities: no pretibial edema bilaterally  Skin: Not pale. Not jaundice Neurologic:  alert & oriented X3.  Speech normal, gait appropriate for age and unassisted Psych--  Cognition and judgment appear intact.  Cooperative with normal attention span and concentration.  Behavior  appropriate. No anxious or depressed appearing.      Assessment     Assessment DM ( A1c  6.05 January 2020) Dyslipidemia Ulcerative colitis, DX 2005, Dr. Randa Evens, Cscope  2010,used  Lialda at some point H/o ED Eczema - feet, rash groin Vit d def dx 11-2015 Chest pain  (-)  stress test 2011 H/o hyperthyroidism Illiterate  PLAN DM: Diet controlled, last A1c very good High cholesterol: Based on last FLP, atorvastatin was increased to 80 mg about 6 months ago, reports good compliance, check FLP AST ALT. Vitamin D deficiency: On OTCs, checking labs UC: Currently well-controlled, no GI symptoms.  Check CBC. Snoring: r/o OSA Saw neurology, home sleep test was mainly to the patient but apparently he never returned.  Recommend to call pulmonary and get the sleep study done.  Again advised that if he has OSA, he can have multiple health consequences. Fatigue: See above, suspect snoring is having a role. RTC 03-2024 CPX   03-2023 Here for CPX -Td 2021 - s/p shingrix   - Recommend: flu shot and covid booster this fall  -Prostate cancer screening: no sxs, no FH check PSA.   -CCS: *Cscope 2010, cscope 08/2017: serrated polyp, + chronic active colitis.  *Cscope 06-2020>>  melanosis, BX ulcerative colitis, polyps benign. -Diet and exercise discussed. -Labs from 03/28/2023: Hemoglobin 15.2.  Platelet count 242.  Creatinine 0.8.  Sodium 137.  Potassium 4.2.  AST ALT normal. Will get the following: FLP A1c micro vitamin D PSA DM: Diet controlled, check A1c, micro.  Feet exam negative. Dyslipidemia: Based on last FLP, Lipitor increased to 40 mg, reports good compliance.  Check FLP. Vit d Def: not on supplements, check labs  Ulcerative colitis Recent rectal bleeding, saw GI, labs obtained, Rx colonoscopy next month. Aspirin for primary prevention?  Currently having some rectal bleeding, will avoid aspirin for now.  Reassess periodically. Snoring: Heavy snoring, witnessed apneas, previous referral to  neurology failure.  Will try again.  Instruction provided in Spanish. RTC 6 months

## 2023-10-04 NOTE — Assessment & Plan Note (Signed)
 DM: Diet controlled, last A1c very good High cholesterol: Based on last FLP, atorvastatin was increased to 80 mg about 6 months ago, reports good compliance, check FLP AST ALT. Vitamin D deficiency: On OTCs, checking labs UC: Currently w/ no sxs.  Check CBC. Snoring:saw neurology, a home sleep test was recommended but apparently the patient never returned. Recommend to call neurology and get the study done.   Again advised that  OSA has multiple health consequences. Fatigue: See above, suspect snoring is having a role. RTC 03-2024 CPX

## 2023-10-07 MED ORDER — VITAMIN D (ERGOCALCIFEROL) 1.25 MG (50000 UNIT) PO CAPS: 50000.0000 [IU] | ORAL_CAPSULE | ORAL | 0 refills | Status: AC

## 2023-10-07 NOTE — Addendum Note (Signed)
 Addended byConrad Lester D on: 10/07/2023 07:28 AM   Modules accepted: Orders

## 2023-11-24 ENCOUNTER — Other Ambulatory Visit: Payer: Self-pay | Admitting: Internal Medicine

## 2023-12-27 ENCOUNTER — Other Ambulatory Visit: Payer: Self-pay | Admitting: Internal Medicine

## 2024-02-23 ENCOUNTER — Other Ambulatory Visit: Payer: Self-pay | Admitting: Internal Medicine

## 2024-02-24 DIAGNOSIS — H43813 Vitreous degeneration, bilateral: Secondary | ICD-10-CM | POA: Diagnosis not present

## 2024-02-24 DIAGNOSIS — H524 Presbyopia: Secondary | ICD-10-CM | POA: Diagnosis not present

## 2024-02-24 DIAGNOSIS — H52223 Regular astigmatism, bilateral: Secondary | ICD-10-CM | POA: Diagnosis not present

## 2024-02-24 DIAGNOSIS — H5203 Hypermetropia, bilateral: Secondary | ICD-10-CM | POA: Diagnosis not present

## 2024-03-06 ENCOUNTER — Other Ambulatory Visit (INDEPENDENT_AMBULATORY_CARE_PROVIDER_SITE_OTHER): Payer: Self-pay

## 2024-03-06 ENCOUNTER — Encounter: Payer: Self-pay | Admitting: Orthopaedic Surgery

## 2024-03-06 ENCOUNTER — Ambulatory Visit (INDEPENDENT_AMBULATORY_CARE_PROVIDER_SITE_OTHER): Admitting: Orthopaedic Surgery

## 2024-03-06 DIAGNOSIS — G8929 Other chronic pain: Secondary | ICD-10-CM

## 2024-03-06 DIAGNOSIS — M25511 Pain in right shoulder: Secondary | ICD-10-CM

## 2024-03-06 MED ORDER — METHYLPREDNISOLONE ACETATE 40 MG/ML IJ SUSP
40.0000 mg | INTRAMUSCULAR | Status: AC | PRN
Start: 2024-03-06 — End: 2024-03-06
  Administered 2024-03-06: 40 mg via INTRA_ARTICULAR

## 2024-03-06 MED ORDER — BUPIVACAINE HCL 0.5 % IJ SOLN
3.0000 mL | INTRAMUSCULAR | Status: AC | PRN
  Administered 2024-03-06: 3 mL via INTRA_ARTICULAR

## 2024-03-06 MED ORDER — LIDOCAINE HCL 1 % IJ SOLN
3.0000 mL | INTRAMUSCULAR | Status: AC | PRN
  Administered 2024-03-06: 3 mL

## 2024-03-06 NOTE — Progress Notes (Signed)
 Office Visit Note   Patient: Travis Greene           Date of Birth: [birth year]           MRN: [id number] Visit Date: 03/06/2024              Requested by: Amon Aloysius BRAVO, MD [address] [place],  [place] [address] PCP: Amon Aloysius BRAVO, MD   Assessment & Plan: Visit Diagnoses:  1. Chronic right shoulder pain     Plan: History of Present Illness Travis Greene is a 58 year old male who presents with right shoulder pain for three months.  He experiences right shoulder pain that is more pronounced at night, disrupting his sleep and preventing him from lying on one side. The pain worsens with arm elevation. He denies recent injuries but recalls a fall many years ago. Occasional numbness and tingling occur in the right shoulder area.  Physical Exam MUSCULOSKELETAL: Right shoulder flexibility normal. Right shoulder pain and slight weakness to manual muscle testing of the supraspinatus, infraspinatus, subscapularis.  Results RADIOLOGY Shoulder X-ray: Degenerative spurring at the humeral head, irregularity suggesting possible chronic rotator cuff tears.  Assessment and Plan Right shoulder pain with rotator cuff tear and primary osteoarthritis Chronic right shoulder pain with rotator cuff tear and primary osteoarthritis confirmed by X-ray showing degenerative spurring. - Administered cortisone injection to right shoulder. - Referred to physical therapy for shoulder strengthening exercises. - Re-evaluate in 4-6 weeks; if no improvement, order MRI of the shoulder.  Follow-Up Instructions: No follow-ups on file.   Orders:  Orders Placed This Encounter  Procedures   Large Joint Inj: R subacromial bursa   XR Shoulder Right   Ambulatory referral to Physical Therapy   No orders of the defined types were placed in this encounter.     Procedures: Large Joint Inj: R subacromial bursa on 03/06/2024 10:30 AM Indications: pain Details: 22 G needle  Arthrogram:  No  Medications: 3 mL lidocaine  1 %; 3 mL bupivacaine  0.5 %; 40 mg methylPREDNISolone  acetate 40 MG/ML Outcome: tolerated well, no immediate complications Consent was given by the patient. Patient was prepped and draped in the usual sterile fashion.       Clinical Data: No additional findings.   Subjective: Chief Complaint  Patient presents with   Right Shoulder - Pain    HPI  Review of Systems  Constitutional: Negative.   HENT: Negative.    Eyes: Negative.   Respiratory: Negative.    Cardiovascular: Negative.   Gastrointestinal: Negative.   Endocrine: Negative.   Genitourinary: Negative.   Skin: Negative.   Allergic/Immunologic: Negative.   Neurological: Negative.   Hematological: Negative.   Psychiatric/Behavioral: Negative.    All other systems reviewed and are negative.    Objective: Vital Signs: There were no vitals taken for this visit.  Physical Exam Vitals and nursing note reviewed.  Constitutional:      Appearance: He is well-developed.  HENT:     Head: Normocephalic and atraumatic.  Eyes:     Pupils: Pupils are equal, round, and reactive to light.  Pulmonary:     Effort: Pulmonary effort is normal.  Abdominal:     Palpations: Abdomen is soft.  Musculoskeletal:        General: Normal range of motion.     Cervical back: Neck supple.  Skin:    General: Skin is warm.  Neurological:     Mental Status: He is alert and oriented to person,  place, and time.  Psychiatric:        Behavior: Behavior normal.        Thought Content: Thought content normal.        Judgment: Judgment normal.     Ortho Exam  Specialty Comments:  No specialty comments available.  Imaging: XR Shoulder Right Result Date: 03/06/2024 3 view xrays show degenerative spurring of the greater tuberosity and AC joint.  No acute findings.  Slightly higher riding humeral head.    PMFS History: Patient Active Problem List   Diagnosis Date Noted   Pain in right knee  08/18/2023   Gastro-esophageal reflux disease with esophagitis 10/14/2021   Dyslipidemia 05/04/2021   Type 2 diabetes mellitus without complication, without long-term current use of insulin (HCC) 01/22/2021   Pneumonia due to COVID-19 virus 12/18/2018   PCP NOTES >>>>>>>>>>>> 11/27/2015   Eczema 10/17/2014   Cauda equina syndrome with neurogenic bladder (HCC) 12/30/2011   Annual physical exam 12/29/2010   Ulcerative colitis (HCC) 01/02/2010   Anxiety state 06/24/2008   Erectile dysfunction 06/24/2008   Past Medical History:  Diagnosis Date   Chest pain    negative stress test 11/2009   Colitis    ED (erectile dysfunction) 11/09   Hyperthyroidism 12/30/2011   Ulcerative colitis Dx 2005   [facility], last Cscope 2010 Dr Celestia, was Rx lialda    Vision abnormalities     Family History  Problem Relation Age of Onset   Diabetes Mother    Hypertension Mother        ?   Coronary artery disease Neg Hx    Prostate cancer Neg Hx    Colon cancer Neg Hx    Sleep apnea Neg Hx     Past Surgical History:  Procedure Laterality Date   APPENDECTOMY     VASECTOMY     Social History   Occupational History   Occupation:  Database administrator   Tobacco Use   Smoking status: Former    Current packs/day: 0.00    Average packs/day: 1 pack/day for 2.0 years (2.0 ttl pk-yrs)    Types: Cigarettes    Start date: 11/07/1995    Quit date: 11/06/1997    Years since quitting: 26.3   Smokeless tobacco: Never   Tobacco comments:    Quit tobacco 1999  Substance and Sexual Activity   Alcohol use: Yes    Comment: socially   Drug use: No    Comment: multiple drugs before, now rarely uses marijuana   Sexual activity: Not on file

## 2024-03-14 ENCOUNTER — Encounter: Payer: Self-pay | Admitting: Physical Therapy

## 2024-03-14 ENCOUNTER — Other Ambulatory Visit: Payer: Self-pay

## 2024-03-14 ENCOUNTER — Ambulatory Visit: Attending: Orthopaedic Surgery | Admitting: Physical Therapy

## 2024-03-14 DIAGNOSIS — M6281 Muscle weakness (generalized): Secondary | ICD-10-CM | POA: Diagnosis not present

## 2024-03-14 DIAGNOSIS — G8929 Other chronic pain: Secondary | ICD-10-CM | POA: Diagnosis not present

## 2024-03-14 DIAGNOSIS — M25511 Pain in right shoulder: Secondary | ICD-10-CM | POA: Diagnosis not present

## 2024-03-14 NOTE — Therapy (Signed)
 OUTPATIENT PHYSICAL THERAPY SHOULDER EVALUATION   Patient Name: Travis Greene MRN: [id number] DOB:Mar 04, 1966, 58 y.o., male Today's Date: 03/14/2024  END OF SESSION:  PT End of Session - 03/14/24 1527     Visit Number 1    Number of Visits 8    Date for PT Re-Evaluation 05/09/24    Authorization Type BCBS    PT Start Time 1445    PT Stop Time 1520    PT Time Calculation (min) 35 min    Activity Tolerance Patient tolerated treatment well    Behavior During Therapy [facility] for tasks assessed/performed          Past Medical History:  Diagnosis Date   Chest pain    negative stress test 11/2009   Colitis    ED (erectile dysfunction) 11/09   Hyperthyroidism 12/30/2011   Ulcerative colitis Dx 2005   [facility], last Cscope 2010 Dr Celestia, was Rx lialda    Vision abnormalities    Past Surgical History:  Procedure Laterality Date   APPENDECTOMY     VASECTOMY     Patient Active Problem List   Diagnosis Date Noted   Pain in right knee 08/18/2023   Gastro-esophageal reflux disease with esophagitis 10/14/2021   Dyslipidemia 05/04/2021   Type 2 diabetes mellitus without complication, without long-term current use of insulin (HCC) 01/22/2021   Pneumonia due to COVID-19 virus 12/18/2018   PCP NOTES >>>>>>>>>>>> 11/27/2015   Eczema 10/17/2014   Cauda equina syndrome with neurogenic bladder (HCC) 12/30/2011   Annual physical exam 12/29/2010   Ulcerative colitis (HCC) 01/02/2010   Anxiety state 06/24/2008   Erectile dysfunction 06/24/2008    PCP: Amon Schanz  REFERRING PROVIDER: Jerri Moody  REFERRING DIAG: chronic Rt shoulder pain  THERAPY DIAG:  Chronic right shoulder pain  Muscle weakness (generalized)  Rationale for Evaluation and Treatment: Rehabilitation  ONSET DATE: 01/2024  SUBJECTIVE:                                                                                                                                                                                       SUBJECTIVE STATEMENT: Pt states he saw MD due to Rt shoulder pain when reaching over head. He states MD gave him an injection and that now his shoulder is feeling better. He states he still occasionally has pain with reaching that eases when out of position. 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 Yes: NPRS scale: none currently 2-3/10 at most Pain location: Rt shoulder Pain description: sore Aggravating factors:  reaching Relieving factors: out of position  PRECAUTIONS: None  RED FLAGS: None   WEIGHT BEARING RESTRICTIONS: No  FALLS:  Has patient fallen in last 6 months? 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  DIAGNOSTIC FINDINGS:  Shoulder X-ray: Degenerative spurring at the humeral head, irregularity suggesting possible chronic rotator cuff tears.   PATIENT SURVEYS:  PSFS: THE PATIENT SPECIFIC FUNCTIONAL SCALE  Place score of 0-10 (0 = unable to perform activity and 10 = able to perform activity at the same level as before injury or problem)  Activity Date: 03/14/24    Reach overhead 8    2.     3.     4.      Total Score 8      Total Score = Sum of activity scores/number of activities  Minimally Detectable Change: 3 points (for single activity); 2 points (for average score)  [facility] (nd). The Patient Specific Functional Scale . Retrieved from [email or web address]   COGNITION: Overall cognitive status: Within functional limits for tasks assessed       POSTURE: Rounded shoulders  UPPER EXTREMITY ROM:   Active ROM Right eval Left eval  Shoulder flexion 158 170   Shoulder extension    Shoulder abduction 138 165  Shoulder adduction    Shoulder internal rotation 80 80  Shoulder external rotation 80 80  Elbow flexion    Elbow extension    Wrist flexion    Wrist extension    Wrist ulnar deviation    Wrist radial deviation    Wrist pronation    Wrist supination    (Blank rows = not tested)  UPPER EXTREMITY MMT:  MMT Right eval Left eval  Shoulder flexion 4+ 5  Shoulder extension    Shoulder abduction 4+ 5  Shoulder adduction    Shoulder internal rotation 4+   Shoulder external rotation 4-   Middle trapezius    Lower trapezius    Elbow flexion 5 5  Elbow extension    Wrist flexion    Wrist extension    Wrist ulnar deviation    Wrist radial deviation    Wrist pronation    Wrist supination    Grip strength (lbs)    (Blank rows = not tested)    JOINT MOBILITY TESTING:  WFL  PALPATION:  No TTP Mild increase in mm spasticity Rt UT                                                                                                                             TREATMENT DATE: 03/14/24 See HEP   PATIENT EDUCATION: Education details: PT POC and goals, HEP Person educated: Patient Education method: Explanation, Demonstration, and Handouts Education comprehension: verbalized understanding and returned demonstration  HOME  EXERCISE PROGRAM: Access Code: [facility] URL: [email or web address] Date: 03/14/2024 Prepared by: Darice Conine  Exercises - Shoulder W - External Rotation with Resistance  - 1 x daily - 7 x weekly - 3 sets - 10 reps - Low Trap Setting at Wall  - 1 x daily - 7 x weekly - 3 sets - 10 reps - Standing Shoulder Horizontal Abduction with Anchored Resistance  - 1 x daily - 7 x weekly - 3 sets - 10 reps  ASSESSMENT:  CLINICAL IMPRESSION: Patient is a 58 y.o. male who was seen today for physical therapy evaluation and treatment for Rt shoulder pain. He presents with decreased ROM and strength and  decreased functional activity tolerance. He will benefit from skilled PT to address deficits and improve functional mobility.   OBJECTIVE IMPAIRMENTS: decreased activity tolerance, decreased ROM, decreased strength, and impaired UE functional use.   ACTIVITY LIMITATIONS: reach over head  PARTICIPATION LIMITATIONS: occupation  PERSONAL FACTORS: Profession and Time since onset of injury/illness/exacerbation are also affecting patient's functional outcome.   REHAB POTENTIAL: Good  CLINICAL DECISION MAKING: Stable/uncomplicated  EVALUATION COMPLEXITY: Low   GOALS: Goals reviewed with patient? Yes  SHORT TERM GOALS: Target date: 04/11/2024    Pt will be independent in initial HEP Baseline: Goal status: INITIAL      LONG TERM GOALS: Target date: 03/14/24  Pt will be independent in HEP for symptom management at home Baseline:  Goal status: INITIAL  2.  Pt will report score of 10 on PSFS to demo improved functional mobility Baseline:  Goal status: INITIAL  3.  Pt will demo Rt UE strength 5/5 to improve functional activity tolerance Baseline:  Goal status: INITIAL    PLAN:  PT FREQUENCY: 1x/week  PT DURATION: 8 weeks  PLANNED INTERVENTIONS: 97164- PT Re-evaluation, 97110-Therapeutic exercises, 97530- Therapeutic activity, 97112- Neuromuscular re-education, 97535- Self Care, 02859- Manual therapy, G0283- Electrical stimulation (unattended), 97016- Vasopneumatic device, [id number]- Ionotophoresis 4mg /ml Dexamethasone, 79439 (1-2 muscles), 20561 (3+ muscles)- Dry Needling, Patient/Family education, Taping, Cryotherapy, and Moist heat  PLAN FOR NEXT SESSION: assess response to HEP, shoulder/scapular muscle endurance   Brown Dunlap, PT 03/14/2024, 3:28 PM  For all possible CPT codes, reference the Planned Interventions line above.     Check all conditions that are expected to impact treatment: {Conditions expected to impact treatment:None of these apply   If treatment  provided at initial evaluation, no treatment charged due to lack of authorization.

## 2024-03-30 ENCOUNTER — Ambulatory Visit: Admitting: Internal Medicine

## 2024-03-30 ENCOUNTER — Encounter: Payer: Self-pay | Admitting: Internal Medicine

## 2024-03-30 VITALS — BP 136/84 | HR 73 | Temp 97.9°F | Resp 16 | Ht 70.0 in | Wt 246.5 lb

## 2024-03-30 DIAGNOSIS — Z125 Encounter for screening for malignant neoplasm of prostate: Secondary | ICD-10-CM | POA: Diagnosis not present

## 2024-03-30 DIAGNOSIS — Z8639 Personal history of other endocrine, nutritional and metabolic disease: Secondary | ICD-10-CM | POA: Diagnosis not present

## 2024-03-30 DIAGNOSIS — E785 Hyperlipidemia, unspecified: Secondary | ICD-10-CM

## 2024-03-30 DIAGNOSIS — E559 Vitamin D deficiency, unspecified: Secondary | ICD-10-CM

## 2024-03-30 DIAGNOSIS — Z23 Encounter for immunization: Secondary | ICD-10-CM

## 2024-03-30 DIAGNOSIS — K5100 Ulcerative (chronic) pancolitis without complications: Secondary | ICD-10-CM | POA: Diagnosis not present

## 2024-03-30 DIAGNOSIS — Z0001 Encounter for general adult medical examination with abnormal findings: Secondary | ICD-10-CM

## 2024-03-30 DIAGNOSIS — Z0000 Encounter for general adult medical examination without abnormal findings: Secondary | ICD-10-CM | POA: Diagnosis not present

## 2024-03-30 DIAGNOSIS — E119 Type 2 diabetes mellitus without complications: Secondary | ICD-10-CM

## 2024-03-30 DIAGNOSIS — Z6835 Body mass index (BMI) 35.0-35.9, adult: Secondary | ICD-10-CM

## 2024-03-30 LAB — COMPREHENSIVE METABOLIC PANEL WITH GFR
ALT: 33 U/L (ref 0–53)
AST: 22 U/L (ref 0–37)
Albumin: 4.4 g/dL (ref 3.5–5.2)
Alkaline Phosphatase: 70 U/L (ref 39–117)
BUN: 24 mg/dL — ABNORMAL HIGH (ref 6–23)
CO2: 31 meq/L (ref 19–32)
Calcium: 8.9 mg/dL (ref 8.4–10.5)
Chloride: 103 meq/L (ref 96–112)
Creatinine, Ser: 0.89 mg/dL (ref 0.40–1.50)
GFR: 93.23 mL/min (ref >60.00)
Glucose, Bld: 112 mg/dL — ABNORMAL HIGH (ref 70–99)
Potassium: 4.3 meq/L (ref 3.5–5.1)
Sodium: 142 meq/L (ref 135–145)
Total Bilirubin: 1 mg/dL (ref 0.2–1.2)
Total Protein: 6.8 g/dL (ref 6.0–8.3)

## 2024-03-30 LAB — HEMOGLOBIN A1C: Hgb A1c MFr Bld: 6.7 % — ABNORMAL HIGH (ref 4.6–6.5)

## 2024-03-30 LAB — CBC WITH DIFFERENTIAL/PLATELET
Basophils Absolute: 0.1 K/uL (ref 0.0–0.1)
Basophils Relative: 0.9 % (ref 0.0–3.0)
Eosinophils Absolute: 0.3 K/uL (ref 0.0–0.7)
Eosinophils Relative: 4.6 % (ref 0.0–5.0)
HCT: 44.9 % (ref 39.0–52.0)
Hemoglobin: 15.2 g/dL (ref 13.0–17.0)
Lymphocytes Relative: 33.9 % (ref 12.0–46.0)
Lymphs Abs: 2.1 K/uL (ref 0.7–4.0)
MCHC: 33.9 g/dL (ref 30.0–36.0)
MCV: 93.9 fl (ref 78.0–100.0)
Monocytes Absolute: 0.7 K/uL (ref 0.1–1.0)
Monocytes Relative: 10.5 % (ref 3.0–12.0)
Neutro Abs: 3.1 K/uL (ref 1.4–7.7)
Neutrophils Relative %: 50.1 % (ref 43.0–77.0)
Platelets: 203 K/uL (ref 150.0–400.0)
RBC: 4.78 Mil/uL (ref 4.22–5.81)
RDW: 12.7 % (ref 11.5–15.5)
WBC: 6.3 K/uL (ref 4.0–10.5)

## 2024-03-30 LAB — TSH: TSH: 0.64 u[IU]/mL (ref 0.35–5.50)

## 2024-03-30 LAB — MICROALBUMIN / CREATININE URINE RATIO
Creatinine,U: 192.6 mg/dL
Microalb Creat Ratio: 7.3 mg/g (ref 0.0–30.0)
Microalb, Ur: 1.4 mg/dL (ref 0.0–1.9)

## 2024-03-30 LAB — VITAMIN D 25 HYDROXY (VIT D DEFICIENCY, FRACTURES): VITD: 47.21 ng/mL (ref 30.00–100.00)

## 2024-03-30 LAB — PSA: PSA: 0.42 ng/mL (ref 0.10–4.00)

## 2024-03-30 MED ORDER — VITAMIN D3 50 MCG (2000 UT) PO CAPS: 2000.0000 [IU] | ORAL_CAPSULE | Freq: Every day | ORAL | Status: AC

## 2024-03-30 NOTE — Assessment & Plan Note (Signed)
 Here for CPX   Other issues addressed DM: Diet controlled, check A1c Dyslipidemia: On atorvastatin  last LDL 46.  No change. Ulcerative colitis: Per GI, on iron , check CBC. Vitamin D  deficiency: Not on supplements, checking levels, reports he felt great when he took ergocalciferol .  H/o hyperthyroidism: Check TSH. Morbid obesity (BMI 35, DM) .  Dietary advice provided. All instructions in English, patient is illiterate, wife can read, discussed extensively in [place]. RTC 4 months

## 2024-03-30 NOTE — Assessment & Plan Note (Signed)
 Here for CPX -Td 2021 - s/p shingrix - PNM 20 today (obesity, DM, UC) - Recommend: flu shot and covid booster   -Prostate cancer screening: no sxs, no FH PSA    -CCS: *Cscope 2010, cscope 08/2017: serrated polyp, + chronic active colitis.  *Cscope 06-2020>>  melanosis, BX ulcerative colitis, polyps benign *Colonoscopy 04/2023, next per GI. -Diet and exercise discussed.  He is already trying to do better -Labs from  CMP A1c micro  TSH PSA CBC vit D

## 2024-03-30 NOTE — Progress Notes (Signed)
 Subjective:    Patient ID: Travis Greene, male    DOB: [birth year], 58 y.o.   MRN: [id number]  DOS:  03/30/2024 Type of visit - description: CPX  Here for CPX. Feels well. Denies chest pain or difficulty breathing. No GI or LUTS.   Review of Systems   A 14 point review of systems is negative    Past Medical History:  Diagnosis Date   Chest pain    negative stress test 11/2009   Colitis    ED (erectile dysfunction) 11/09   Hyperthyroidism 12/30/2011   Ulcerative colitis Dx 2005   [facility], last Cscope 2010 Dr Celestia, was Rx lialda    Vision abnormalities     Past Surgical History:  Procedure Laterality Date   APPENDECTOMY     VASECTOMY      Current Outpatient Medications  Medication Instructions   atorvastatin  (LIPITOR) 80 mg, Oral, Daily at bedtime   esomeprazole (NEXIUM) 40 mg, Daily   ferrous sulfate (FEROSUL) 325 mg, Oral, 2 times daily with meals   hydrocortisone  2.5 % cream Topical, 2 times daily   Lialda  1.2 g, Daily with breakfast   sildenafil  (REVATIO ) 20 MG tablet TAKE THREE TO FOUR TABLETS AT BEDTIME as needed   Vitamin D3 2,000 Units, Oral, Daily       Objective:   Physical Exam BP 136/84   Pulse 73   Temp 97.9 F (36.6 C) (Oral)   Resp 16   Ht 5' 10 (1.778 m)   Wt 246 lb 8 oz (111.8 kg)   SpO2 95%   BMI 35.37 kg/m  General: Well developed, NAD, BMI noted Neck: No  thyromegaly  HEENT:  Normocephalic . Face symmetric, atraumatic Lungs:  CTA B Normal respiratory effort, no intercostal retractions, no accessory muscle use. Heart: RRR,  no murmur.  Abdomen:  Not distended, soft, non-tender. No rebound or rigidity.   Lower extremities: no pretibial edema bilaterally  Skin: Exposed areas without rash. Not pale. Not jaundice Neurologic:  alert & oriented X3.  Speech normal, gait appropriate for age and unassisted Strength symmetric and appropriate for age.  Psych: Cognition and judgment appear intact.  Cooperative with normal  attention span and concentration.  Behavior appropriate. No anxious or depressed appearing.     Assessment   Assessment DM ( A1c  6.05 January 2020) Dyslipidemia Ulcerative colitis, DX 2005, Dr. Celestia, Cscope  2010,used  Lialda  at some point H/o ED Eczema - feet, rash groin Vit d def dx 11-2015 Chest pain  (-)  stress test 2011 H/o hyperthyroidism Illiterate  PLAN  Here for CPX -Td 2021 - s/p shingrix - PNM 20 today (obesity, DM, UC) - Recommend: flu shot and covid booster   -Prostate cancer screening: no sxs, no FH PSA    -CCS: *Cscope 2010, cscope 08/2017: serrated polyp, + chronic active colitis.  *Cscope 06-2020>>  melanosis, BX ulcerative colitis, polyps benign *Colonoscopy 04/2023, next per GI. -Diet and exercise discussed.  He is already trying to do better -Labs from  CMP A1c micro  TSH PSA CBC vit D   Other issues addressed DM: Diet controlled, check A1c Dyslipidemia: On atorvastatin  last LDL 46.  No change. Ulcerative colitis: Per GI, on iron , check CBC. Vitamin D  deficiency: Not on supplements, checking levels, reports he felt great when he took ergocalciferol .  H/o hyperthyroidism: Check TSH. Morbid obesity (BMI 35, DM) .  Dietary advice provided. All instructions in English, patient is illiterate, wife can read, discussed extensively in [place].  RTC 4 months

## 2024-03-30 NOTE — Patient Instructions (Addendum)
 Vaccines I recommend: Flu shot COVID booster  Take over-the-counter vitamin D : 2000 units every day    Check the  blood pressure regularly Blood pressure goal:  between 110/65 and  135/85. If it is consistently higher or lower, let me know     GO TO THE LAB :  Get the blood work   Your results will be posted on MyChart with my comments  Go to the front desk for the checkout Please make an appointment for a checkup in 4 months

## 2024-03-31 ENCOUNTER — Ambulatory Visit: Payer: Self-pay | Admitting: Internal Medicine

## 2024-04-17 DIAGNOSIS — K219 Gastro-esophageal reflux disease without esophagitis: Secondary | ICD-10-CM | POA: Diagnosis not present

## 2024-04-17 DIAGNOSIS — Z8719 Personal history of other diseases of the digestive system: Secondary | ICD-10-CM | POA: Diagnosis not present

## 2024-04-17 DIAGNOSIS — K5100 Ulcerative (chronic) pancolitis without complications: Secondary | ICD-10-CM | POA: Diagnosis not present

## 2024-05-07 ENCOUNTER — Other Ambulatory Visit: Payer: Self-pay | Admitting: Internal Medicine

## 2024-06-04 ENCOUNTER — Encounter: Payer: Self-pay | Admitting: Radiology

## 2024-06-26 ENCOUNTER — Other Ambulatory Visit: Payer: Self-pay | Admitting: Internal Medicine

## 2024-08-03 ENCOUNTER — Ambulatory Visit: Admitting: Internal Medicine

## 2024-10-09 ENCOUNTER — Ambulatory Visit: Admitting: Internal Medicine
